# Patient Record
Sex: Female | Born: 1965 | Race: Black or African American | Hispanic: No | Marital: Married | Smoking: Current every day smoker
Health system: Southern US, Community
[De-identification: ages and names within clinical notes are randomized; demographics above are authoritative.]

## PROBLEM LIST (undated history)

## (undated) DIAGNOSIS — R519 Headache, unspecified: Secondary | ICD-10-CM

## (undated) DIAGNOSIS — R079 Chest pain, unspecified: Secondary | ICD-10-CM

## (undated) DIAGNOSIS — F32A Depression, unspecified: Secondary | ICD-10-CM

## (undated) DIAGNOSIS — R413 Other amnesia: Secondary | ICD-10-CM

## (undated) DIAGNOSIS — R002 Palpitations: Secondary | ICD-10-CM

## (undated) DIAGNOSIS — F419 Anxiety disorder, unspecified: Secondary | ICD-10-CM

## (undated) HISTORY — DX: Chest pain, unspecified: R07.9

## (undated) HISTORY — DX: Palpitations: R00.2

## (undated) HISTORY — DX: Anxiety disorder, unspecified: F41.9

## (undated) HISTORY — DX: Headache, unspecified: R51.9

## (undated) HISTORY — DX: Depression, unspecified: F32.A

## (undated) HISTORY — DX: Other amnesia: R41.3

## (undated) NOTE — Telephone Encounter (Signed)
 Formatting of this note is different from the original. Notes/instructions were copied from original source. Erminio Potters Rn  Patient Message 07/16/2020 [name]  MRN: [id number]  Encounter Messages Expand AllCollapse All Results  From  [facility] [place] [facility] [place] To  Buffie Herne and Delivered  07/16/2020 9:12 AM  Last Read in [email or web address]  07/16/2020 2:23 PM by Kristine Griffith  Hello Kristine Griffith,  My name is Laymon Isles and I am a Writer reviewing labs and messages for [employer], CNM.  Pam has retired and I will do my best to help with this transition.    Your pap smear results show normal cells on your cervix, but your HPV testing was positive.  Because of this, we would like to repeat your pap smear in one year.  Pap smears are typically performed every 3-5 years, but in the case of positive HPV testing, we will repeat this is one year.    Please feel free to contact me with any questions or concerns.    Laymon GORMAN Isles, CNM  Electronically signed by Potters Erminio RN at 01/27/2021  9:07 PM EDT

## (undated) NOTE — Telephone Encounter (Signed)
 Formatting of this note might be different from the original. Provider Laymon GORMAN Isles, CNM addressed with member.  Encounter date 07/16/2020, (secure patient msg. copied below).  Pt notified of abnormal pap smear results and plan of care for follow up pap in one year, (per provider's 07/16/2020 e-mail msg., copied below.    HMM added.  [facility] indicates provider's e-mail message sent on 07/16/2020 regarding pap results and one year follow up pap recommendation has been last read by member on 07/16/2020.  Electronically signed by Leigh America RN at 01/27/2021  9:07 PM EDT

---

## 1998-07-20 ENCOUNTER — Ambulatory Visit (HOSPITAL_COMMUNITY): Admission: AD | Admit: 1998-07-20 | Discharge: 1998-07-20 | Payer: Self-pay | Admitting: Obstetrics & Gynecology

## 1998-09-21 ENCOUNTER — Inpatient Hospital Stay (HOSPITAL_COMMUNITY): Admission: AD | Admit: 1998-09-21 | Discharge: 1998-09-21 | Payer: Self-pay | Admitting: Obstetrics & Gynecology

## 1998-09-24 ENCOUNTER — Inpatient Hospital Stay (HOSPITAL_COMMUNITY): Admission: AD | Admit: 1998-09-24 | Discharge: 1998-09-24 | Payer: Self-pay | Admitting: Obstetrics

## 1998-10-20 ENCOUNTER — Other Ambulatory Visit: Admission: RE | Admit: 1998-10-20 | Discharge: 1998-10-20 | Payer: Self-pay | Admitting: Obstetrics

## 1998-10-20 ENCOUNTER — Ambulatory Visit (HOSPITAL_COMMUNITY): Admission: RE | Admit: 1998-10-20 | Discharge: 1998-10-20 | Payer: Self-pay | Admitting: Obstetrics

## 1998-11-19 ENCOUNTER — Ambulatory Visit (HOSPITAL_COMMUNITY): Admission: RE | Admit: 1998-11-19 | Discharge: 1998-11-19 | Payer: Self-pay | Admitting: Obstetrics

## 1999-01-15 ENCOUNTER — Ambulatory Visit (HOSPITAL_COMMUNITY): Admission: RE | Admit: 1999-01-15 | Discharge: 1999-01-15 | Payer: Self-pay | Admitting: Obstetrics

## 1999-04-27 ENCOUNTER — Inpatient Hospital Stay (HOSPITAL_COMMUNITY): Admission: AD | Admit: 1999-04-27 | Discharge: 1999-04-29 | Payer: Self-pay | Admitting: Obstetrics

## 1999-05-04 ENCOUNTER — Inpatient Hospital Stay (HOSPITAL_COMMUNITY): Admission: AD | Admit: 1999-05-04 | Discharge: 1999-05-04 | Payer: Self-pay | Admitting: General Surgery

## 2000-07-20 ENCOUNTER — Emergency Department (HOSPITAL_COMMUNITY): Admission: EM | Admit: 2000-07-20 | Discharge: 2000-07-20 | Payer: Self-pay | Admitting: Emergency Medicine

## 2000-07-20 ENCOUNTER — Encounter: Payer: Self-pay | Admitting: *Deleted

## 2000-10-02 ENCOUNTER — Inpatient Hospital Stay (HOSPITAL_COMMUNITY): Admission: AD | Admit: 2000-10-02 | Discharge: 2000-10-02 | Payer: Self-pay | Admitting: Obstetrics

## 2001-03-17 ENCOUNTER — Inpatient Hospital Stay (HOSPITAL_COMMUNITY): Admission: AD | Admit: 2001-03-17 | Discharge: 2001-03-17 | Payer: Self-pay | Admitting: Obstetrics

## 2001-03-22 ENCOUNTER — Encounter: Payer: Self-pay | Admitting: Obstetrics & Gynecology

## 2001-03-22 ENCOUNTER — Inpatient Hospital Stay (HOSPITAL_COMMUNITY): Admission: AD | Admit: 2001-03-22 | Discharge: 2001-03-22 | Payer: Self-pay | Admitting: *Deleted

## 2001-03-26 ENCOUNTER — Inpatient Hospital Stay (HOSPITAL_COMMUNITY): Admission: AD | Admit: 2001-03-26 | Discharge: 2001-03-26 | Payer: Self-pay | Admitting: Obstetrics & Gynecology

## 2001-05-31 ENCOUNTER — Other Ambulatory Visit: Admission: RE | Admit: 2001-05-31 | Discharge: 2001-05-31 | Payer: Self-pay | Admitting: Obstetrics and Gynecology

## 2001-06-26 ENCOUNTER — Encounter: Payer: Self-pay | Admitting: Obstetrics and Gynecology

## 2001-06-26 ENCOUNTER — Inpatient Hospital Stay (HOSPITAL_COMMUNITY): Admission: AD | Admit: 2001-06-26 | Discharge: 2001-06-26 | Payer: Self-pay | Admitting: Obstetrics and Gynecology

## 2001-06-28 ENCOUNTER — Inpatient Hospital Stay (HOSPITAL_COMMUNITY): Admission: AD | Admit: 2001-06-28 | Discharge: 2001-06-30 | Payer: Self-pay | Admitting: Obstetrics and Gynecology

## 2001-12-11 ENCOUNTER — Inpatient Hospital Stay (HOSPITAL_COMMUNITY): Admission: AD | Admit: 2001-12-11 | Discharge: 2001-12-11 | Payer: Self-pay | Admitting: Obstetrics and Gynecology

## 2001-12-18 ENCOUNTER — Encounter (HOSPITAL_COMMUNITY): Admission: RE | Admit: 2001-12-18 | Discharge: 2002-01-08 | Payer: Self-pay | Admitting: Obstetrics and Gynecology

## 2002-01-11 ENCOUNTER — Inpatient Hospital Stay (HOSPITAL_COMMUNITY): Admission: AD | Admit: 2002-01-11 | Discharge: 2002-01-13 | Payer: Self-pay | Admitting: Obstetrics and Gynecology

## 2002-01-11 ENCOUNTER — Encounter (INDEPENDENT_AMBULATORY_CARE_PROVIDER_SITE_OTHER): Payer: Self-pay

## 2002-01-20 ENCOUNTER — Encounter: Payer: Self-pay | Admitting: Obstetrics and Gynecology

## 2002-01-20 ENCOUNTER — Observation Stay (HOSPITAL_COMMUNITY): Admission: AD | Admit: 2002-01-20 | Discharge: 2002-01-21 | Payer: Self-pay | Admitting: Obstetrics and Gynecology

## 2002-02-26 ENCOUNTER — Inpatient Hospital Stay (HOSPITAL_COMMUNITY): Admission: AD | Admit: 2002-02-26 | Discharge: 2002-02-26 | Payer: Self-pay | Admitting: Obstetrics and Gynecology

## 2002-07-18 ENCOUNTER — Inpatient Hospital Stay (HOSPITAL_COMMUNITY): Admission: AD | Admit: 2002-07-18 | Discharge: 2002-07-18 | Payer: Self-pay | Admitting: Obstetrics and Gynecology

## 2002-07-26 ENCOUNTER — Inpatient Hospital Stay (HOSPITAL_COMMUNITY): Admission: AD | Admit: 2002-07-26 | Discharge: 2002-07-26 | Payer: Self-pay | Admitting: Obstetrics and Gynecology

## 2002-09-28 ENCOUNTER — Emergency Department (HOSPITAL_COMMUNITY): Admission: EM | Admit: 2002-09-28 | Discharge: 2002-09-28 | Payer: Self-pay | Admitting: Emergency Medicine

## 2002-12-24 ENCOUNTER — Emergency Department (HOSPITAL_COMMUNITY): Admission: EM | Admit: 2002-12-24 | Discharge: 2002-12-24 | Payer: Self-pay | Admitting: *Deleted

## 2003-09-20 ENCOUNTER — Emergency Department (HOSPITAL_COMMUNITY): Admission: EM | Admit: 2003-09-20 | Discharge: 2003-09-20 | Payer: Self-pay | Admitting: Emergency Medicine

## 2004-06-28 ENCOUNTER — Emergency Department (HOSPITAL_COMMUNITY): Admission: EM | Admit: 2004-06-28 | Discharge: 2004-06-29 | Payer: Self-pay

## 2004-07-08 ENCOUNTER — Emergency Department (HOSPITAL_COMMUNITY): Admission: EM | Admit: 2004-07-08 | Discharge: 2004-07-08 | Payer: Self-pay | Admitting: Family Medicine

## 2005-04-05 ENCOUNTER — Emergency Department (HOSPITAL_COMMUNITY): Admission: EM | Admit: 2005-04-05 | Discharge: 2005-04-05 | Payer: Self-pay | Admitting: Emergency Medicine

## 2005-11-09 ENCOUNTER — Emergency Department (HOSPITAL_COMMUNITY): Admission: EM | Admit: 2005-11-09 | Discharge: 2005-11-09 | Payer: Self-pay | Admitting: Emergency Medicine

## 2005-12-12 ENCOUNTER — Emergency Department (HOSPITAL_COMMUNITY): Admission: EM | Admit: 2005-12-12 | Discharge: 2005-12-12 | Payer: Self-pay | Admitting: Emergency Medicine

## 2007-03-29 ENCOUNTER — Emergency Department (HOSPITAL_COMMUNITY): Admission: EM | Admit: 2007-03-29 | Discharge: 2007-03-29 | Payer: Self-pay | Admitting: Emergency Medicine

## 2007-03-29 ENCOUNTER — Inpatient Hospital Stay (HOSPITAL_COMMUNITY): Admission: AD | Admit: 2007-03-29 | Discharge: 2007-03-29 | Payer: Self-pay | Admitting: Obstetrics and Gynecology

## 2007-11-16 ENCOUNTER — Emergency Department (HOSPITAL_COMMUNITY): Admission: EM | Admit: 2007-11-16 | Discharge: 2007-11-16 | Payer: Self-pay | Admitting: Emergency Medicine

## 2008-08-15 ENCOUNTER — Emergency Department (HOSPITAL_COMMUNITY): Admission: EM | Admit: 2008-08-15 | Discharge: 2008-08-15 | Payer: Self-pay | Admitting: Family Medicine

## 2009-01-13 ENCOUNTER — Emergency Department (HOSPITAL_COMMUNITY): Admission: EM | Admit: 2009-01-13 | Discharge: 2009-01-13 | Payer: Self-pay | Admitting: Emergency Medicine

## 2009-07-29 ENCOUNTER — Emergency Department (HOSPITAL_COMMUNITY): Admission: EM | Admit: 2009-07-29 | Discharge: 2009-07-29 | Payer: Self-pay | Admitting: Family Medicine
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
## 2011-03-25 NOTE — Discharge Summary (Signed)
[facility]  Patient:    Kristine Griffith, Kristine Griffith Visit Number: [id number] MRN: [id number]          Service Type: OBV Location: 9300 [id number] Attending Physician:  Wandalee Ferdinand Dictated by:   Rudy Jew Ashley Royalty, M.D. Admit Date:  01/20/2002 Discharge Date: 01/21/2002                             Discharge Summary  DISCHARGE DIAGNOSES:          1. Intrauterine pregnancy at term,                                  delivered.                               2. Term birth living child, vertex.                               3. Desire for attempt at permanent                                  surgical sterilization.                               4. Status post right partial salpingectomy                                  including fimbriectomy.  OPERATIONS AND SPECIAL        1. OB delivery. PROCEDURES:                   2. Left tubal sterilization procedure.  CONSULTATIONS:                None.  HISTORY AND PHYSICAL:         This is a 45 year old, gravida 6, para 3-0-2-3, [redacted] weeks gestation, prenatal care uncomplicated except for smoking. The patient was admitted for induction secondary to smoking and advanced cervical changes. For the remainder of the past medical history, please see the chart.  HOSPITAL COURSE:              The patient was admitted to [facility] [place]. Admission laboratory studies were drawn. On January 11, 2002 she went on to deliver a 6-pound 3-ounce female, Apgars 9 at one minute, 9 at five minutes, sent to the newborn nursery. Delivery was accomplished per Dr. Lonell Face over at intact perineum. After delivery the patient stated a desire for attempt at permanent surgical sterilization. On January 11, 2002, she was taken to the operating room and underwent postpartum tubal sterilization. At the time of the procedure, no right fallopian tube could be identified. A left Pomeroy tubal sterilization procedure was performed. The  operation was uncomplicated. After the operation was completed, the operative note from the patients previous ectopic pregnancy was obtained and the operative report revealed a partial salpingectomy. The pathology report was also obtained which documented fimbriectomy. Hence, the patient was reassured that subsequent pregnancy chances are very remote. She was discharged home on January 13, 2002 afebrile  and in satisfactory condition.  ACCESSORY CLINICAL FINDINGS:      Hemoglobin and hematocrit on admission were 12.5 and 35.3, respectively. Repeat values obtained January 12, 2002 were 11.2 and 32.6, respectively.  DISPOSITION:                  The patient is to return to [facility] in four to six weeks for postpartum evaluation. Dictated by:   Rudy Jew Ashley Royalty, M.D. Attending Physician:  Wandalee Ferdinand DD:  02/02/02 TD:  02/02/02 Job: [id number]

## 2011-03-25 NOTE — Op Note (Signed)
[facility]  Patient:    Kristine Griffith, Kristine Griffith Visit Number: [id number] MRN: [id number]          Service Type: ANT Location: [facility] Attending Physician:  Wandalee Ferdinand Dictated by:   Rudy Jew Ashley Royalty, M.D. Admit Date:  12/18/2001 Discharge Date: 01/08/2002                             Operative Report  PREOPERATIVE DIAGNOSIS:       1. Desire for attempted permanent sterilization.                               2. Apparent right salpingectomy. POSTOPERATIVE DIAGNOSIS:      1. Desire for attempted permanent sterilization.                               2. Apparent right salpingectomy.                               3. Left tubal sterilization procedure                                  Barnett Abu).  OPERATION:                    Left tubal sterilization procedure Barnett Abu).  SURGEON:                      Rudy Jew. Ashley Royalty, M.D.  ANESTHESIA:                   Epidural.  ESTIMATED BLOOD LOSS:         Less than 10 cc.  COMPLICATIONS:                None.  PACKS AND DRAINS:             None.  DESCRIPTION OF PROCEDURE:     The patient was taken to the operating room, placed in the dorsal supine position.  She was prepped and draped in the usual manner for abdominal surgery.  Functioning epidural catheter was in place.  A 1.5 cm infraumbilical incision was made in the transverse plane.  Subcutaneous tissues were sharply dissected down to the fascia which was elevated with hemostats and entered atraumatically with Mayo scissors.  The underlying tissues were sharply and bluntly dissected down to the peritoneum which was elevated with hemostats and entered atraumatically with Metzenbaum scissors. Incision was extended slightly towards to accommodate [employer] retractors. The left fallopian tube was identified and traced to its fimbriated end with Babcock clamp.  An avascular area in the distal isthmic portion was chosen for ligation.  A #1 plain catgut  suture was placed on this segment of fallopian tube and tied.  A second suture was placed as well.  The intervening portion of fallopian tube was incised with Metzenbaum scissors and sent to pathology for histologic studies.  Hemostasis was noted.  Next, attention was turned to the right aspect of the uterus.  The round ligament was identified.  No fallopian tube could be identified.  We noted that there were a couple of references in the patients old chart alluding to a history  of a right salpingectomy even though the patients history was vague.  The intraoperative findings tended to corroborate the history of an ectopic pregnancy with right salpingectomy.  Hence, it was felt that the procedure was complete.  The medical records department had been asked to produce an operative note from the patients surgery for ectopic pregnancy prior to the patient having otherwise unprotected sex.  The fascia was closed with 0 Vicryl in a running fashion.  The skin was closed with 3-0 chromic in a subcuticular fashion.  The patient was taken to the recovery room in excellent condition. Dictated by:   Rudy Jew Ashley Royalty, M.D. Attending Physician:  Wandalee Ferdinand DD:  01/11/02 TD:  01/14/02 Job: [id number]

## 2011-03-25 NOTE — Op Note (Signed)
[facility]  Patient:    Kristine Griffith A Visit Number: [id number] MRN: [id number]          Service Type: GYN Location: [facility] Attending Physician:  Antionette Char Proc. Date: 07/20/98 Admit Date:  3//99/09/1   CC:         [facility]                           Operative Report  PREOPERATIVE DIAGNOSIS:       Cervical weakness.  POSTOPERATIVE DIAGNOSIS:      Cervical weakness.  PROCEDURE:                    McDonald cerclage.  SURGEON:                      Roseanna Rainbow, M.D.  ANESTHESIA:                   Spinal.  COMPLICATIONS:                None.  ESTIMATED BLOOD LOSS:         Less than 50 cc.  FINDINGS:                     Exam under anesthesia prior to placement of the suture revealed the cervix to have significant length to it with the external os being approximately 1-cm dilated, the internal os likely fingertip.  PROCEDURE:                    The patient was taken to the operating room where a spinal anesthetic was administered without difficulty.  She was then placed in the dorsal lithotomy position and prepped and draped in the usual sterile fashion.  A weighted speculum was then placed into the posterior fornix.  The cervix and vagina were irrigated with clindamycin douche.  Deavers were used to retract superiorly and laterally.  The cervix was grasped with sponge forceps and the level of the bladder reflection was noted anteriorly.  Just distal to the vesicocervical reflection, a suture comprised of a double suture of silk was used to encircle the cervix in four to five bites in a pursestring fashion.  The knot was then placed anteriorly with the cut free-ends left long. The vagina and cervix were then again irrigated with a clindamycin solution.  At the close of the procedure, the instrument and pack counts were said to be correct x 2.  The patient was taken to the PACU awake and in stable  condition. Attending Physician:  Antionette Char DD:  07/20/98 TD:  07/20/98 Job: [id number]

## 2011-03-25 NOTE — Discharge Summary (Signed)
[facility]  Patient:    Kristine Griffith, Kristine Griffith Visit Number: [id number] MRN: [id number]          Service Type: MED Location: 9300 [id number] Attending Physician:  Wandalee Ferdinand Dictated by:   Rudy Jew Ashley Royalty, M.D. Admit Date:  06/28/2001 Discharge Date: 06/30/2001                             Discharge Summary  DISCHARGE DIAGNOSES:          1. Intrauterine pregnancy at [redacted] weeks                                  gestation.                               2. Pyelonephritis.  OPERATIONS AND SPECIAL PROCEDURES:           None.  CONSULTATIONS:                None.  DISCHARGE MEDICATIONS:        1. Macrobid one b.i.d. x 10 days.                               2. Percocet one p.o. q.3-4h. p.r.n. pain.  HISTORY AND PHYSICAL:         This is a 45 year old, gravida 6, para 3, Ab 3, who presented August 20 complaining of UTI symptoms. She was given a diagnosis of urinary tract infection and given a prescription for Macrobid but never had it filled. She returned on August 22 complaining of chills, headache, left lower quadrant discomfort, etc. She was found to have CVA tenderness on the right side as well as a microscopic urine suggestive of urinary tract infection. She was hence admitted for diagnosis of intrauterine pregnancy with pyelonephritis.  HOSPITAL COURSE:              The patient was admitted to [facility]. Initial laboratory studies were normal. She was given IV antibiotics consisting of Unasyn 3 g IV q.6h. She responded nicely to this regimen, and on June 30, 2001 she was felt stable for discharge, and was discharged home in satisfactory condition.  ACCESSORY CLINICAL FINDINGS:  Hemoglobin and hematocrit on admission were 12.5 and 36.4, respectively. Repeat values obtained on August 23: 10.6 and 30.7, respectively. White blood count on these _______ was 14.7 and 11.3, respectively. Urinalysis revealed small  hemoglobin, greater than 80 ketones, negative leukocyte esterase, few epithelials, rare bacteria.  DISPOSITION:                  The patient is to return to [facility] in several days for followup. Dictated by:   Rudy Jew Ashley Royalty, M.D. Attending Physician:  Wandalee Ferdinand DD:  07/24/01 TD:  07/24/01 Job: [id number]

## 2011-03-25 NOTE — Discharge Summary (Signed)
[facility]  Patient:    LOYOLA, SANTINO Visit Number: [id number] MRN: [id number]          Service Type: Attending:  Rudy Jew. Ashley Royalty, M.D. Dictated by:   Rudy Jew Ashley Royalty, M.D. Adm. Date:  01/20/02 Disc. Date: 01/21/02                             Discharge Summary  DISCHARGE DIAGNOSES:          1. Probable postoperative discomfort from left                                  tubal sterilization procedure.                               2. Vaginal bleeding approximately 10 days                                  postpartum, appropriate.  PROCEDURES:                   None.  DISCHARGE MEDICATIONS:        Lorcet Plus one q.3-4h. p.r.n. pain.  HISTORY OF PRESENT ILLNESS:   This is a 45 year old patient 10 days postpartum.  She had a left tubal sterilization procedure performed on the day of delivery.  She is status post right salpingectomy and has had no procedure done on that side.  The patient presented yesterday complaining of bleeding and cramping postpartum.  She was seen in the emergency room at [facility] and evaluated by Ronda Fairly. Galen Daft, M.D.  An ultrasound was obtained and reportedly the ultrasound revealed findings compatible with retained products of conception.  The placenta was removed spontaneously at the time of delivery.  No manual removal was required.  The patient was admitted for observation and for trial of antibiotics.  She was placed on 23-hour observation only.  HOSPITAL COURSE:              CBC was obtained initially, which revealed white blood count to be perfectly normal at 7600.  Hemoglobin was 12.1, hematocrit 35.5.  Repeat values were obtained the next day, which revealed a white blood count of 6900, hemoglobin 11.4, and hematocrit 32.8, respectively.  She appeared to have pain associated with the left tubal sterilization procedure only.  The bleeding was modest, and a decision was made to allow her to return home  with careful follow-up.  ACCESSORY CLINICAL FINDINGS:  As above.  DISPOSITION:                  The patient is discharged home afebrile and in satisfactory condition.  She was given a prescription for Lorcet Plus #20, one p.o. q.3-4h. p.r.n. pain.  She is asked specifically to remain NPO after midnight tonight and to call the office in the morning for an update.  She was also asked to keep her standard postpartum appointment as well. Dictated by:   Rudy Jew Ashley Royalty, M.D. Attending:  Rudy Jew Ashley Royalty, M.D. DD:  01/21/02 TD:  01/22/02 Job: [id number]

## 2011-07-27 LAB — INFLUENZA A+B VIRUS AG-DIRECT(RAPID): Inflenza A Ag: NEGATIVE

## 2011-07-27 LAB — RAPID STREP SCREEN (MED CTR MEBANE ONLY): Streptococcus, Group A Screen (Direct): NEGATIVE

## 2017-06-01 ENCOUNTER — Ambulatory Visit (INDEPENDENT_AMBULATORY_CARE_PROVIDER_SITE_OTHER): Payer: Self-pay | Admitting: Nurse Practitioner

## 2017-06-01 ENCOUNTER — Encounter: Payer: Self-pay | Admitting: Nurse Practitioner

## 2017-06-01 VITALS — BP 100/64 | HR 90 | Temp 98.8°F | Ht 67.0 in | Wt 146.2 lb

## 2017-06-01 DIAGNOSIS — Z0000 Encounter for general adult medical examination without abnormal findings: Secondary | ICD-10-CM

## 2017-06-01 DIAGNOSIS — F319 Bipolar disorder, unspecified: Secondary | ICD-10-CM

## 2017-06-01 NOTE — Patient Instructions (Addendum)
Health Maintenance, Female Adopting a healthy lifestyle and getting preventive care can go a long way to promote health and wellness. Talk with your health care provider about what schedule of regular examinations is right for you. This is a good chance for you to check in with your provider about disease prevention and staying healthy. In between checkups, there are plenty of things you can do on your own. Experts have done a lot of research about which lifestyle changes and preventive measures are most likely to keep you healthy. Ask your health care provider for more information. Weight and diet Eat a healthy diet  Be sure to include plenty of vegetables, fruits, low-fat dairy products, and lean protein.  Do not eat a lot of foods high in solid fats, added sugars, or salt.  Get regular exercise. This is one of the most important things you can do for your health. ? Most adults should exercise for at least 150 minutes each week. The exercise should increase your heart rate and make you sweat (moderate-intensity exercise). ? Most adults should also do strengthening exercises at least twice a week. This is in addition to the moderate-intensity exercise.  Maintain a healthy weight  Body mass index (BMI) is a measurement that can be used to identify possible weight problems. It estimates body fat based on height and weight. Your health care provider can help determine your BMI and help you achieve or maintain a healthy weight.  For females 20 years of age and older: ? A BMI below 18.5 is considered underweight. ? A BMI of 18.5 to 24.9 is normal. ? A BMI of 25 to 29.9 is considered overweight. ? A BMI of 30 and above is considered obese.  Watch levels of cholesterol and blood lipids  You should start having your blood tested for lipids and cholesterol at 51 years of age, then have this test every 5 years.  You may need to have your cholesterol levels checked more often if: ? Your lipid or  cholesterol levels are high. ? You are older than 50 years of age. ? You are at high risk for heart disease.  Cancer screening Lung Cancer  Lung cancer screening is recommended for adults 55-80 years old who are at high risk for lung cancer because of a history of smoking.  A yearly low-dose CT scan of the lungs is recommended for people who: ? Currently smoke. ? Have quit within the past 15 years. ? Have at least a 30-pack-year history of smoking. A pack year is smoking an average of one pack of cigarettes a day for 1 year.  Yearly screening should continue until it has been 15 years since you quit.  Yearly screening should stop if you develop a health problem that would prevent you from having lung cancer treatment.  Breast Cancer  Practice breast self-awareness. This means understanding how your breasts normally appear and feel.  It also means doing regular breast self-exams. Let your health care provider know about any changes, no matter how small.  If you are in your 20s or 30s, you should have a clinical breast exam (CBE) by a health care provider every 1-3 years as part of a regular health exam.  If you are 40 or older, have a CBE every year. Also consider having a breast X-ray (mammogram) every year.  If you have a family history of breast cancer, talk to your health care provider about genetic screening.  If you are at high risk   for breast cancer, talk to your health care provider about having an MRI and a mammogram every year.  Breast cancer gene (BRCA) assessment is recommended for women who have family members with BRCA-related cancers. BRCA-related cancers include: ? Breast. ? Ovarian. ? Tubal. ? Peritoneal cancers.  Results of the assessment will determine the need for genetic counseling and BRCA1 and BRCA2 testing.  Cervical Cancer Your health care provider may recommend that you be screened regularly for cancer of the pelvic organs (ovaries, uterus, and  vagina). This screening involves a pelvic examination, including checking for microscopic changes to the surface of your cervix (Pap test). You may be encouraged to have this screening done every 3 years, beginning at age 22.  For women ages 56-65, health care providers may recommend pelvic exams and Pap testing every 3 years, or they may recommend the Pap and pelvic exam, combined with testing for human papilloma virus (HPV), every 5 years. Some types of HPV increase your risk of cervical cancer. Testing for HPV may also be done on women of any age with unclear Pap test results.  Other health care providers may not recommend any screening for nonpregnant women who are considered low risk for pelvic cancer and who do not have symptoms. Ask your health care provider if a screening pelvic exam is right for you.  If you have had past treatment for cervical cancer or a condition that could lead to cancer, you need Pap tests and screening for cancer for at least 20 years after your treatment. If Pap tests have been discontinued, your risk factors (such as having a new sexual partner) need to be reassessed to determine if screening should resume. Some women have medical problems that increase the chance of getting cervical cancer. In these cases, your health care provider may recommend more frequent screening and Pap tests.  Colorectal Cancer  This type of cancer can be detected and often prevented.  Routine colorectal cancer screening usually begins at 51 years of age and continues through 51 years of age.  Your health care provider may recommend screening at an earlier age if you have risk factors for colon cancer.  Your health care provider may also recommend using home test kits to check for hidden blood in the stool.  A small camera at the end of a tube can be used to examine your colon directly (sigmoidoscopy or colonoscopy). This is done to check for the earliest forms of colorectal  cancer.  Routine screening usually begins at age 33.  Direct examination of the colon should be repeated every 5-10 years through 51 years of age. However, you may need to be screened more often if early forms of precancerous polyps or small growths are found.  Skin Cancer  Check your skin from head to toe regularly.  Tell your health care provider about any new moles or changes in moles, especially if there is a change in a mole's shape or color.  Also tell your health care provider if you have a mole that is larger than the size of a pencil eraser.  Always use sunscreen. Apply sunscreen liberally and repeatedly throughout the day.  Protect yourself by wearing long sleeves, pants, a wide-brimmed hat, and sunglasses whenever you are outside.  Heart disease, diabetes, and high blood pressure  High blood pressure causes heart disease and increases the risk of stroke. High blood pressure is more likely to develop in: ? People who have blood pressure in the high end of  the normal range (130-139/85-89 mm Hg). ? People who are overweight or obese. ? People who are African American.  If you are 21-29 years of age, have your blood pressure checked every 3-5 years. If you are 3 years of age or older, have your blood pressure checked every year. You should have your blood pressure measured twice-once when you are at a hospital or clinic, and once when you are not at a hospital or clinic. Record the average of the two measurements. To check your blood pressure when you are not at a hospital or clinic, you can use: ? An automated blood pressure machine at a pharmacy. ? A home blood pressure monitor.  If you are between 17 years and 37 years old, ask your health care provider if you should take aspirin to prevent strokes.  Have regular diabetes screenings. This involves taking a blood sample to check your fasting blood sugar level. ? If you are at a normal weight and have a low risk for diabetes,  have this test once every three years after 51 years of age. ? If you are overweight and have a high risk for diabetes, consider being tested at a younger age or more often. Preventing infection Hepatitis B  If you have a higher risk for hepatitis B, you should be screened for this virus. You are considered at high risk for hepatitis B if: ? You were born in a country where hepatitis B is common. Ask your health care provider which countries are considered high risk. ? Your parents were born in a high-risk country, and you have not been immunized against hepatitis B (hepatitis B vaccine). ? You have HIV or AIDS. ? You use needles to inject street drugs. ? You live with someone who has hepatitis B. ? You have had sex with someone who has hepatitis B. ? You get hemodialysis treatment. ? You take certain medicines for conditions, including cancer, organ transplantation, and autoimmune conditions.  Hepatitis C  Blood testing is recommended for: ? Everyone born from [birth year] through 1965. ? Anyone with known risk factors for hepatitis C.  Sexually transmitted infections (STIs)  You should be screened for sexually transmitted infections (STIs) including gonorrhea and chlamydia if: ? You are sexually active and are younger than 51 years of age. ? You are older than 51 years of age and your health care provider tells you that you are at risk for this type of infection. ? Your sexual activity has changed since you were last screened and you are at an increased risk for chlamydia or gonorrhea. Ask your health care provider if you are at risk.  If you do not have HIV, but are at risk, it may be recommended that you take a prescription medicine daily to prevent HIV infection. This is called pre-exposure prophylaxis (PrEP). You are considered at risk if: ? You are sexually active and do not regularly use condoms or know the HIV status of your partner(s). ? You take drugs by injection. ? You are  sexually active with a partner who has HIV.  Talk with your health care provider about whether you are at high risk of being infected with HIV. If you choose to begin PrEP, you should first be tested for HIV. You should then be tested every 3 months for as long as you are taking PrEP. Pregnancy  If you are premenopausal and you may become pregnant, ask your health care provider about preconception counseling.  If you may become  pregnant, take 400 to 800 micrograms (mcg) of folic acid every day.  If you want to prevent pregnancy, talk to your health care provider about birth control (contraception). Osteoporosis and menopause  Osteoporosis is a disease in which the bones lose minerals and strength with aging. This can result in serious bone fractures. Your risk for osteoporosis can be identified using a bone density scan.  If you are 75 years of age or older, or if you are at risk for osteoporosis and fractures, ask your health care provider if you should be screened.  Ask your health care provider whether you should take a calcium or vitamin D supplement to lower your risk for osteoporosis.  Menopause may have certain physical symptoms and risks.  Hormone replacement therapy may reduce some of these symptoms and risks. Talk to your health care provider about whether hormone replacement therapy is right for you. Follow these instructions at home:  Schedule regular health, dental, and eye exams.  Stay current with your immunizations.  Do not use any tobacco products including cigarettes, chewing tobacco, or electronic cigarettes.  If you are pregnant, do not drink alcohol.  If you are breastfeeding, limit how much and how often you drink alcohol.  Limit alcohol intake to no more than 1 drink per day for nonpregnant women. One drink equals 12 ounces of beer, 5 ounces of wine, or 1 ounces of hard liquor.  Do not use street drugs.  Do not share needles.  Ask your health care  provider for help if you need support or information about quitting drugs.  Tell your health care provider if you often feel depressed.  Tell your health care provider if you have ever been abused or do not feel safe at home. This information is not intended to replace advice given to you by your health care provider. Make sure you discuss any questions you have with your health care provider. Document Released: 05/09/2011 Document Revised: 03/31/2016 Document Reviewed: 07/28/2015 Elsevier Interactive Patient Education  2018 [employer].  Steps to Quit Smoking Smoking tobacco can be harmful to your health and can affect almost every organ in your body. Smoking puts you, and those around you, at risk for developing many serious chronic diseases. Quitting smoking is difficult, but it is one of the best things that you can do for your health. It is never too late to quit. What are the benefits of quitting smoking? When you quit smoking, you lower your risk of developing serious diseases and conditions, such as:  Lung cancer or lung disease, such as COPD.  Heart disease.  Stroke.  Heart attack.  Infertility.  Osteoporosis and bone fractures.  Additionally, symptoms such as coughing, wheezing, and shortness of breath may get better when you quit. You may also find that you get sick less often because your body is stronger at fighting off colds and infections. If you are pregnant, quitting smoking can help to reduce your chances of having a baby of low birth weight. How do I get ready to quit? When you decide to quit smoking, create a plan to make sure that you are successful. Before you quit:  Pick a date to quit. Set a date within the next two weeks to give you time to prepare.  Write down the reasons why you are quitting. Keep this list in places where you will see it often, such as on your bathroom mirror or in your car or wallet.  Identify the people, places, things, and  activities  that make you want to smoke (triggers) and avoid them. Make sure to take these actions: ? Throw away all cigarettes at home, at work, and in your car. ? Throw away smoking accessories, such as Scientist, research (medical). ? Clean your car and make sure to empty the ashtray. ? Clean your home, including curtains and carpets.  Tell your family, friends, and coworkers that you are quitting. Support from your loved ones can make quitting easier.  Talk with your health care provider about your options for quitting smoking.  Find out what treatment options are covered by your health insurance.  What strategies can I use to quit smoking? Talk with your healthcare provider about different strategies to quit smoking. Some strategies include:  Quitting smoking altogether instead of gradually lessening how much you smoke over a period of time. Research shows that quitting "cold [place]" is more successful than gradually quitting.  Attending in-person counseling to help you build problem-solving skills. You are more likely to have success in quitting if you attend several counseling sessions. Even short sessions of 10 minutes can be effective.  Finding resources and support systems that can help you to quit smoking and remain smoke-free after you quit. These resources are most helpful when you use them often. They can include: ? Online chats with a Social worker. ? Telephone quitlines. ? Careers information officer. ? Support groups or group counseling. ? Text messaging programs. ? Mobile phone applications.  Taking medicines to help you quit smoking. (If you are pregnant or breastfeeding, talk with your health care provider first.) Some medicines contain nicotine and some do not. Both types of medicines help with cravings, but the medicines that include nicotine help to relieve withdrawal symptoms. Your health care provider may recommend: ? Nicotine patches, gum, or lozenges. ? Nicotine inhalers or  sprays. ? Non-nicotine medicine that is taken by mouth.  Talk with your health care provider about combining strategies, such as taking medicines while you are also receiving in-person counseling. Using these two strategies together makes you more likely to succeed in quitting than if you used either strategy on its own. If you are pregnant or breastfeeding, talk with your health care provider about finding counseling or other support strategies to quit smoking. Do not take medicine to help you quit smoking unless told to do so by your health care provider. What things can I do to make it easier to quit? Quitting smoking might feel overwhelming at first, but there is a lot that you can do to make it easier. Take these important actions:  Reach out to your family and friends and ask that they support and encourage you during this time. Call telephone quitlines, reach out to support groups, or work with a counselor for support.  Ask people who smoke to avoid smoking around you.  Avoid places that trigger you to smoke, such as bars, parties, or smoke-break areas at work.  Spend time around people who do not smoke.  Lessen stress in your life, because stress can be a smoking trigger for some people. To lessen stress, try: ? Exercising regularly. ? Deep-breathing exercises. ? Yoga. ? Meditating. ? Performing a body scan. This involves closing your eyes, scanning your body from head to toe, and noticing which parts of your body are particularly tense. Purposefully relax the muscles in those areas.  Download or purchase mobile phone or tablet apps (applications) that can help you stick to your quit plan by providing reminders, tips, and  encouragement. There are many free apps, such as QuitGuide from the [employer] developer for Disease Control and Prevention). You can find other support for quitting smoking (smoking cessation) through smokefree.gov and other websites.  How will I feel when I quit  smoking? Within the first 24 hours of quitting smoking, you may start to feel some withdrawal symptoms. These symptoms are usually most noticeable 2-3 days after quitting, but they usually do not last beyond 2-3 weeks. Changes or symptoms that you might experience include:  Mood swings.  Restlessness, anxiety, or irritation.  Difficulty concentrating.  Dizziness.  Strong cravings for sugary foods in addition to nicotine.  Mild weight gain.  Constipation.  Nausea.  Coughing or a sore throat.  Changes in how your medicines work in your body.  A depressed mood.  Difficulty sleeping (insomnia).  After the first 2-3 weeks of quitting, you may start to notice more positive results, such as:  Improved sense of smell and taste.  Decreased coughing and sore throat.  Slower heart rate.  Lower blood pressure.  Clearer skin.  The ability to breathe more easily.  Fewer sick days.  Quitting smoking is very challenging for most people. Do not get discouraged if you are not successful the first time. Some people need to make many attempts to quit before they achieve long-term success. Do your best to stick to your quit plan, and talk with your health care provider if you have any questions or concerns. This information is not intended to replace advice given to you by your health care provider. Make sure you discuss any questions you have with your health care provider. Document Released: 10/18/2001 Document Revised: 06/21/2016 Document Reviewed: 03/10/2015 Elsevier Interactive Patient Education  2017 Else[employer] Inc[employer]  Bipolar 1 Disorder Bipolar 1 disorder is a mental health disorder in which a person has episodes of emotional highs (mania), and may also have episodes of emotional lows (depression) in addition to highs. Bipolar 1 disorder is different from other bipolar disorders because it involves extreme manic episodes. These episodes last at least one week or involve symptoms that  are so severe that hospitalization is needed to keep the person safe. What increases the risk? The cause of this condition is not known. However, certain factors make you more likely to have bipolar disorder, such as:  Having a family member with the disorder.  An imbalance of certain chemicals in the brain (neurotransmitters).  Stress, such as illness, financial problems, or a death.  Certain conditions that affect the brain or spinal cord (neurologic conditions).  Brain injury (trauma).  Having another mental health disorder, such as: ? Obsessive compulsive disorder. ? Schizophrenia.  What are the signs or symptoms? Symptoms of mania include:  Very high self-esteem or self-confidence.  Decreased need for sleep.  Unusual talkativeness or feeling a need to keep talking. Speech may be very fast. It may seem like you cannot stop talking.  Racing thoughts or constant talking, with quick shifts between topics that may or may not be related (flight of ideas).  Decreased ability to focus or concentrate.  Increased purposeful activity, such as work, studies, or social activity.  Increased nonproductive activity. This could be pacing, squirming and fidgeting, or finger and toe tapping.  Impulsive behavior and poor judgment. This may result in high-risk activities, such as having unprotected sex or spending a lot of money.  Symptoms of depression include:  Feeling sad, hopeless, or helpless.  Frequent or uncontrollable crying.  Lack of feeling or caring  about anything.  Sleeping too much.  Moving more slowly than usual.  Not being able to enjoy things you used to enjoy.  Wanting to be alone all the time.  Feeling guilty or worthless.  Lack of energy or motivation.  Trouble concentrating or remembering.  Trouble making decisions.  Increased appetite.  Thoughts of death, or the desire to harm yourself.  Sometimes, you may have a mixed mood. This means having  symptoms of depression and mania. Stress can make symptoms worse. How is this diagnosed? To diagnose bipolar disorder, your health care provider may ask about your:  Emotional episodes.  Medical history.  Alcohol and drug use. This includes prescription medicines. Certain medical conditions and substances can cause symptoms that seem like bipolar disorder (secondary bipolar disorder).  How is this treated? Bipolar disorder is a long-term (chronic) illness. It is best controlled with ongoing (continuous) treatment rather than treatment only when symptoms occur. Treatment may include:  Medicine. Medicine can be prescribed by a provider who specializes in treating mental disorders (psychiatrist). ? Medicines called mood stabilizers are usually prescribed. ? If symptoms occur even while taking a mood stabilizer, other medicines may be added.  Psychotherapy. Some forms of talk therapy, such as cognitive-behavioral therapy (CBT), can provide support, education, and guidance.  Coping methods, such as journaling or relaxation exercises. These may include: ? Yoga. ? Meditation. ? Deep breathing.  Lifestyle changes, such as: ? Limiting alcohol and drug use. ? Exercising regularly. ? Getting plenty of sleep. ? Making healthy eating choices.  A combination of medicine, talk therapy, and coping methods is best. A procedure in which electricity is applied to the brain through the scalp (electroconvulsive therapy) may be used in cases of severe mania when medicine and psychotherapy work too slowly or do not work. Follow these instructions at home: Activity   Return to your normal activities as told by your health care provider.  Find activities that you enjoy, and make time to do them.  Exercise regularly as told by your health care provider. Lifestyle  Limit alcohol intake to no more than 1 drink a day for nonpregnant women and 2 drinks a day for men. One drink equals 12 oz of beer, 5 oz  of wine, or 1 oz of hard liquor.  Follow a set schedule for eating and sleeping.  Eat a balanced diet that includes fresh fruits and vegetables, whole grains, low-fat dairy, and lean meat.  Get 7-8 hours of sleep each night. General instructions  Take over-the-counter and prescription medicines only as told by your health care provider.  Think about joining a support group. Your health care provider may be able to recommend a support group.  Talk with your family and loved ones about your treatment goals and how they can help.  Keep all follow-up visits as told by your health care provider. This is important. Where to find more information: For more information about bipolar disorder, visit the following websites:  [employer] on Mental Illness: www.nami.[place]: https://carter.com/  Contact a health care provider if:  Your symptoms get worse.  You have side effects from your medicine, and they get worse.  You have trouble sleeping.  You have trouble doing daily activities.  You feel unsafe in your surroundings.  You are dealing with substance abuse. Get help right away if:  You have new symptoms.  You have thoughts about harming yourself.  You self-harm. This information is not intended to  replace advice given to you by your health care provider. Make sure you discuss any questions you have with your health care provider. Document Released: 01/30/2001 Document Revised: 06/19/2016 Document Reviewed: 06/23/2016 Elsevier Interactive Patient Education  [employer].
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
## 2023-10-08 DIAGNOSIS — Z419 Encounter for procedure for purposes other than remedying health state, unspecified: Secondary | ICD-10-CM | POA: Diagnosis not present

## 2023-11-08 DIAGNOSIS — Z419 Encounter for procedure for purposes other than remedying health state, unspecified: Secondary | ICD-10-CM | POA: Diagnosis not present

## 2023-12-09 DIAGNOSIS — Z419 Encounter for procedure for purposes other than remedying health state, unspecified: Secondary | ICD-10-CM | POA: Diagnosis not present

## 2024-01-03 ENCOUNTER — Emergency Department (HOSPITAL_COMMUNITY)
Admission: EM | Admit: 2024-01-03 | Discharge: 2024-01-03 | Disposition: A | Discharge status: Home / Self Care | Payer: Medicaid Other

## 2024-01-03 ENCOUNTER — Other Ambulatory Visit: Payer: Self-pay

## 2024-01-03 DIAGNOSIS — X118XXA Contact with other hot tap-water, initial encounter: Secondary | ICD-10-CM | POA: Insufficient documentation

## 2024-01-03 DIAGNOSIS — T24112A Burn of first degree of left thigh, initial encounter: Secondary | ICD-10-CM | POA: Insufficient documentation

## 2024-01-03 DIAGNOSIS — Z789 Other specified health status: Secondary | ICD-10-CM | POA: Diagnosis not present

## 2024-01-03 DIAGNOSIS — T310 Burns involving less than 10% of body surface: Secondary | ICD-10-CM | POA: Diagnosis not present

## 2024-01-03 DIAGNOSIS — T24211A Burn of second degree of right thigh, initial encounter: Secondary | ICD-10-CM | POA: Diagnosis not present

## 2024-01-03 DIAGNOSIS — R9431 Abnormal electrocardiogram [ECG] [EKG]: Secondary | ICD-10-CM | POA: Diagnosis not present

## 2024-01-03 DIAGNOSIS — T300 Burn of unspecified body region, unspecified degree: Secondary | ICD-10-CM

## 2024-01-03 DIAGNOSIS — T24212A Burn of second degree of left thigh, initial encounter: Secondary | ICD-10-CM | POA: Diagnosis not present

## 2024-01-03 DIAGNOSIS — I959 Hypotension, unspecified: Secondary | ICD-10-CM | POA: Diagnosis not present

## 2024-01-03 LAB — BASIC METABOLIC PANEL
Anion gap: 14 (ref 5–15)
BUN: 18 mg/dL (ref 6–20)
CO2: 23 mmol/L (ref 22–32)
Calcium: 9.6 mg/dL (ref 8.9–10.3)
Chloride: 106 mmol/L (ref 98–111)
Creatinine, Ser: 0.83 mg/dL (ref 0.44–1.00)
GFR, Estimated: >60 mL/min (ref >60)
Glucose, Bld: 94 mg/dL (ref 70–99)
Potassium: 4.1 mmol/L (ref 3.5–5.1)
Sodium: 143 mmol/L (ref 135–145)

## 2024-01-03 LAB — CBC
HCT: 37.4 % (ref 36.0–46.0)
Hemoglobin: 12.6 g/dL (ref 12.0–15.0)
MCH: 31.4 pg (ref 26.0–34.0)
MCHC: 33.7 g/dL (ref 30.0–36.0)
MCV: 93.3 fL (ref 80.0–100.0)
Platelets: 218 10*3/uL (ref 150–400)
RBC: 4.01 MIL/uL (ref 3.87–5.11)
RDW: 13.9 % (ref 11.5–15.5)
WBC: 9.7 10*3/uL (ref 4.0–10.5)
nRBC: 0 % (ref 0.0–0.2)

## 2024-01-03 MED ORDER — BACITRACIN ZINC 500 UNIT/GM EX OINT: TOPICAL_OINTMENT | Freq: Once | CUTANEOUS | Status: DC

## 2024-01-03 MED ORDER — HYDROMORPHONE HCL 1 MG/ML IJ SOLN
0.5000 mg | Freq: Once | INTRAMUSCULAR | Status: AC
  Administered 2024-01-03: 0.5 mg via INTRAVENOUS
  Filled 2024-01-03: qty 1

## 2024-01-03 MED ORDER — OXYCODONE-ACETAMINOPHEN 5-325 MG PO TABS: 1.0000 | ORAL_TABLET | Freq: Four times a day (QID) | ORAL | 0 refills | Status: DC | PRN

## 2024-01-03 MED ORDER — OXYCODONE-ACETAMINOPHEN 5-325 MG PO TABS
1.0000 | ORAL_TABLET | Freq: Once | ORAL | Status: AC
  Administered 2024-01-03: 1 via ORAL
  Filled 2024-01-03: qty 1
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
## 2024-01-06 DIAGNOSIS — Z419 Encounter for procedure for purposes other than remedying health state, unspecified: Secondary | ICD-10-CM | POA: Diagnosis not present

## 2024-01-18 ENCOUNTER — Encounter: Payer: Self-pay | Admitting: Obstetrics and Gynecology

## 2024-02-17 DIAGNOSIS — Z419 Encounter for procedure for purposes other than remedying health state, unspecified: Secondary | ICD-10-CM | POA: Diagnosis not present

## 2024-03-18 DIAGNOSIS — Z419 Encounter for procedure for purposes other than remedying health state, unspecified: Secondary | ICD-10-CM | POA: Diagnosis not present

## 2024-04-04 ENCOUNTER — Encounter (HOSPITAL_COMMUNITY): Payer: Self-pay

## 2024-04-04 ENCOUNTER — Emergency Department (HOSPITAL_COMMUNITY)
Admission: EM | Admit: 2024-04-04 | Discharge: 2024-04-04 | Disposition: A | Discharge status: Home / Self Care | Attending: Emergency Medicine | Admitting: Emergency Medicine

## 2024-04-04 ENCOUNTER — Other Ambulatory Visit: Payer: Self-pay

## 2024-04-04 DIAGNOSIS — G8929 Other chronic pain: Secondary | ICD-10-CM | POA: Diagnosis not present

## 2024-04-04 DIAGNOSIS — M545 Low back pain, unspecified: Secondary | ICD-10-CM | POA: Diagnosis not present

## 2024-04-04 DIAGNOSIS — M5459 Other low back pain: Secondary | ICD-10-CM | POA: Diagnosis not present

## 2024-04-04 MED ORDER — OXYCODONE-ACETAMINOPHEN 5-325 MG PO TABS
1.0000 | ORAL_TABLET | ORAL | Status: DC | PRN
  Administered 2024-04-04: 1 via ORAL
  Filled 2024-04-04: qty 1

## 2024-04-04 MED ORDER — GABAPENTIN 300 MG PO CAPS: 300.0000 mg | ORAL_CAPSULE | Freq: Three times a day (TID) | ORAL | 0 refills | Status: DC

## 2024-04-04 MED ORDER — CYCLOBENZAPRINE HCL 10 MG PO TABS: 10.0000 mg | ORAL_TABLET | Freq: Two times a day (BID) | ORAL | 0 refills | Status: DC | PRN

## 2024-04-04 MED ORDER — IBUPROFEN 400 MG PO TABS
400.0000 mg | ORAL_TABLET | Freq: Once | ORAL | Status: AC | PRN
  Administered 2024-04-04: 400 mg via ORAL
  Filled 2024-04-04: qty 1

## 2024-04-04 NOTE — ED Provider Notes (Signed)
 [place] EMERGENCY DEPARTMENT AT [facility] Provider Note   CSN: [id number] Arrival date & time: 04/04/24  1035     History  Chief Complaint  Patient presents with   Back Pain    Kristine Griffith is a 58 y.o. female.  HPI 58 year old female history of low back pain presents today with worsened back pain over the past 3 days.  She states that she has been on Neurontin in the past.  She took one of her dad's Neurontin and feels that this helps.  She is currently new to the area and does not have a current provider.  She has been taking over-the-counter ibuprofen without relief.  She received Percocet and ibuprofen here prior to my evaluation and feels some relief with this.  She denies loss of bowel or bladder control, numbness, tingling, or weakness.  She has had no falls or direct trauma to the area, fever, or history of IV drug use    Home Medications Prior to Admission medications   Medication Sig Start Date End Date Taking? Authorizing Provider  cyclobenzaprine (FLEXERIL) 10 MG tablet Take 1 tablet (10 mg total) by mouth 2 (two) times daily as needed for muscle spasms. 04/04/24  Yes Auston Blush, MD  gabapentin (NEURONTIN) 300 MG capsule Take 1 capsule (300 mg total) by mouth 3 (three) times daily. 04/04/24  Yes Auston Blush, MD  oxyCODONE -acetaminophen  (PERCOCET/ROXICET) 5-325 MG tablet Take 1 tablet by mouth every 6 (six) hours as needed for severe pain (pain score 7-10). 01/03/24   Rolinda Climes, DO      Allergies    Penicillins    Review of Systems   Review of Systems  Physical Exam Updated Vital Signs BP 126/84 (BP Location: Right Arm)   Pulse 88   Temp 98.5 F (36.9 C)   Resp 20   Ht 1.676 m (5\' 6" )   Wt 64.9 kg   SpO2 100%   BMI 23.08 kg/m  Physical Exam Vitals and nursing note reviewed.  Constitutional:      General: She is not in acute distress.    Appearance: She is well-developed.  HENT:     Head: Normocephalic and atraumatic.      Right Ear: External ear normal.     Left Ear: External ear normal.     Nose: Nose normal.  Eyes:     Conjunctiva/sclera: Conjunctivae normal.     Pupils: Pupils are equal, round, and reactive to light.  Cardiovascular:     Rate and Rhythm: Normal rate and regular rhythm.  Pulmonary:     Effort: Pulmonary effort is normal.  Abdominal:     Palpations: Abdomen is soft.  Musculoskeletal:        General: Normal range of motion.     Cervical back: Normal range of motion and neck supple.     Comments: No signs of trauma on low back exam, no point tenderness  Skin:    General: Skin is warm and dry.     Capillary Refill: Capillary refill takes less than 2 seconds.  Neurological:     General: No focal deficit present.     Mental Status: She is alert and oriented to person, place, and time.     Motor: No abnormal muscle tone.     Coordination: Coordination normal.     Comments: Normal gait no neurological deficit noted  Psychiatric:        Behavior: Behavior normal.  Thought Content: Thought content normal.     ED Results / Procedures / Treatments   Labs (all labs ordered are listed, but only abnormal results are displayed) Labs Reviewed - No data to display  EKG None  Radiology No results found.  Procedures Procedures    Medications Ordered in ED Medications  oxyCODONE -acetaminophen  (PERCOCET/ROXICET) 5-325 MG per tablet 1 tablet (1 tablet Oral Given 04/04/24 1046)  ibuprofen (ADVIL) tablet 400 mg (400 mg Oral Given 04/04/24 1046)    ED Course/ Medical Decision Making/ A&P                                 Medical Decision Making Risk Prescription drug management.   58 year old female comes in today with atraumatic low back pain that appears to be acute superimposed on chronic.  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  Pain is in the low back area without radiation or neurological symptoms.  She denies  loss of bowel or bladder control.  She has been on Neurontin in the past and states that this helps.  She has been taking ibuprofen at home.  She received Percocet here in the ED. No red flags on exam. Patient advised regarding need for follow-up Will give short course of Neurontin and Flexeril. Patient advised of return precautions.        Final Clinical Impression(s) / ED Diagnoses Final diagnoses:  Chronic midline low back pain without sciatica    Rx / DC Orders ED Discharge Orders          Ordered    cyclobenzaprine (FLEXERIL) 10 MG tablet  2 times daily PRN        04/04/24 1256    gabapentin (NEURONTIN) 300 MG capsule  3 times daily        04/04/24 1256              Auston Blush, MD 04/04/24 1258

## 2024-04-04 NOTE — ED Triage Notes (Signed)
 Reports hx of DDD and has flare ups in her lower back.  Reports it flared up 3 days ago and not getting better.

## 2024-04-04 NOTE — ED Notes (Signed)
 Patient verbalizes understanding of discharge instructions. Opportunity for questioning and answers were provided. Pt discharged from ED.

## 2024-04-18 DIAGNOSIS — Z419 Encounter for procedure for purposes other than remedying health state, unspecified: Secondary | ICD-10-CM | POA: Diagnosis not present

## 2024-05-04 DIAGNOSIS — H5213 Myopia, bilateral: Secondary | ICD-10-CM | POA: Diagnosis not present

## 2024-05-18 DIAGNOSIS — Z419 Encounter for procedure for purposes other than remedying health state, unspecified: Secondary | ICD-10-CM | POA: Diagnosis not present

## 2024-05-21 ENCOUNTER — Encounter

## 2024-06-18 DIAGNOSIS — Z419 Encounter for procedure for purposes other than remedying health state, unspecified: Secondary | ICD-10-CM | POA: Diagnosis not present

## 2024-06-19 ENCOUNTER — Ambulatory Visit (INDEPENDENT_AMBULATORY_CARE_PROVIDER_SITE_OTHER)

## 2024-06-19 VITALS — BP 115/80 | HR 74 | Temp 97.3°F | Ht 66.0 in | Wt 115.0 lb

## 2024-06-19 DIAGNOSIS — Z0000 Encounter for general adult medical examination without abnormal findings: Secondary | ICD-10-CM | POA: Insufficient documentation

## 2024-06-19 DIAGNOSIS — Z13228 Encounter for screening for other metabolic disorders: Secondary | ICD-10-CM | POA: Diagnosis not present

## 2024-06-19 DIAGNOSIS — F411 Generalized anxiety disorder: Secondary | ICD-10-CM | POA: Diagnosis not present

## 2024-06-19 DIAGNOSIS — Z1159 Encounter for screening for other viral diseases: Secondary | ICD-10-CM

## 2024-06-19 DIAGNOSIS — F332 Major depressive disorder, recurrent severe without psychotic features: Secondary | ICD-10-CM

## 2024-06-19 DIAGNOSIS — Z1329 Encounter for screening for other suspected endocrine disorder: Secondary | ICD-10-CM

## 2024-06-19 DIAGNOSIS — Z130 Encounter for screening for diseases of the blood and blood-forming organs and certain disorders involving the immune mechanism: Secondary | ICD-10-CM

## 2024-06-19 DIAGNOSIS — F329 Major depressive disorder, single episode, unspecified: Secondary | ICD-10-CM | POA: Insufficient documentation

## 2024-06-19 DIAGNOSIS — Z1321 Encounter for screening for nutritional disorder: Secondary | ICD-10-CM | POA: Diagnosis not present

## 2024-06-19 DIAGNOSIS — F17200 Nicotine dependence, unspecified, uncomplicated: Secondary | ICD-10-CM | POA: Insufficient documentation

## 2024-06-19 MED ORDER — BUPROPION HCL ER (SR) 200 MG PO TB12: 200.0000 mg | ORAL_TABLET | Freq: Two times a day (BID) | ORAL | 2 refills | Status: DC

## 2024-06-19 MED ORDER — HYDROXYZINE PAMOATE 25 MG PO CAPS: 25.0000 mg | ORAL_CAPSULE | Freq: Three times a day (TID) | ORAL | 2 refills | Status: DC | PRN

## 2024-06-19 MED ORDER — BUSPIRONE HCL 5 MG PO TABS: 5.0000 mg | ORAL_TABLET | Freq: Two times a day (BID) | ORAL | 2 refills | Status: DC

## 2024-06-19 NOTE — Progress Notes (Deleted)
 New Patient Office Visit  Subjective    Patient ID: Kristine Griffith, female    DOB: 1966-04-14  Age: 58 y.o. MRN: [id number]  CC:  Chief Complaint  Patient presents with   New Patient (Initial Visit)   History of Present Illness     HPI Kristine Griffith presents to establish care. Prior PCP was T . Last physical was about 1 year ago. she notes that she requires refills of ***.  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   buPROPion  (WELLBUTRIN  SR) 200 MG 12 hr tablet Take 1 tablet (200 mg total) by mouth 2 (two) times daily.   busPIRone  (BUSPAR ) 5 MG tablet Take 1 tablet (5 mg total) by mouth 2 (two) times daily.   cefUROXime (CEFTIN) 500 MG tablet Take 500 mg by mouth.   cyclobenzaprine  (FLEXERIL ) 10 MG tablet Take 1 tablet (10 mg total) by mouth 2 (two) times daily as needed for muscle spasms.   gabapentin  (NEURONTIN ) 300 MG capsule Take 1 capsule (300 mg total) by mouth 3 (three) times daily.   hydrOXYzine  (VISTARIL ) 25 MG capsule Take 1 capsule (25 mg total) by mouth every 8 (eight) hours as needed for anxiety.   oxyCODONE -acetaminophen  (PERCOCET/ROXICET) 5-325 MG tablet Take 1 tablet by mouth every 6 (six) hours as needed for severe pain (pain score 7-10).   [DISCONTINUED] buPROPion  (WELLBUTRIN  SR) 200 MG 12 hr tablet Take 200 mg by mouth.   No facility-administered encounter medications on file as of 06/19/2024.    Past Medical History:  Diagnosis Date   Anxiety    Chest pain    Depression    Frequent headaches    Memory  loss    Palpitation     History reviewed. No pertinent surgical history.  Family History  Problem Relation Age of Onset   Depression Mother    Stroke Father    Hypertension Father    Hyperlipidemia Father    Diabetes Father     Social History   Socioeconomic History   Marital status: Married    Spouse name: Not on file   Number of children: 4   Years of education: Not on file   Highest education level: Not on file  Occupational History   Not on file  Tobacco Use   Smoking status: Every Day    Current packs/day: 1.00    Types: Cigarettes   Smokeless tobacco: Not on file  Vaping Use   Vaping status: Never Used  Substance and Sexual Activity   Alcohol use: Never   Drug use: Yes    Types: Marijuana   Sexual activity: Not Currently  Other Topics Concern   Not on file  Social History Narrative   Not on file   Social Drivers of Health   Financial Resource Strain: Not on file  Food Insecurity: Food Insecurity Present (06/19/2024)   Hunger Vital Sign    Worried About Running Out of Food in the Last Year: Often true    Ran Out of Food in the  Last Year: Sometimes true  Transportation Needs: No Transportation Needs (06/19/2024)   PRAPARE - Administrator, Civil Service (Medical): No    Lack of Transportation (Non-Medical): No  Physical Activity: Not on file  Stress: Not on file  Social Connections: Not on file  Intimate Partner Violence: Not At Risk (06/19/2024)   Humiliation, Afraid, Rape, and Kick questionnaire    Fear of Current or Ex-Partner: No    Emotionally Abused: No    Physically Abused: No    Sexually Abused: No    ROS  Per HPI      Objective    BP 115/80   Pulse 74   Temp (!) 97.3 F (36.3 C) (Oral)   Ht 5' 6 (1.676 m)   Wt 115 lb (52.2 kg)   SpO2 98%   BMI 18.56 kg/m   Physical Exam  {Labs (Optional):23779}    Assessment & Plan:   Generalized anxiety disorder -     Ambulatory referral to Psychiatry -     Ambulatory  referral to Psychology  Severe episode of recurrent major depressive disorder, without psychotic features (HCC) -     Ambulatory referral to Psychiatry -     Ambulatory referral to Psychology  Screening for endocrine, nutritional, metabolic and immunity disorder -     VITAMIN D  25 Hydroxy (Vit-D Deficiency, Fractures); Future -     CBC with Differential/Platelet; Future -     Hemoglobin A1c; Future -     Comprehensive metabolic panel with GFR; Future -     Lipid panel; Future -     TSH; Future  Screening for viral disease -     HIV Antibody (routine testing w rflx); Future -     Hepatitis C antibody; Future  Other orders -     buPROPion  HCl ER (SR); Take 1 tablet (200 mg total) by mouth 2 (two) times daily.  Dispense: 180 tablet; Refill: 2 -     busPIRone  HCl; Take 1 tablet (5 mg total) by mouth 2 (two) times daily.  Dispense: 120 tablet; Refill: 2 -     hydrOXYzine  Pamoate; Take 1 capsule (25 mg total) by mouth every 8 (eight) hours as needed for anxiety.  Dispense: 30 capsule; Refill: 2    Assessment and Plan Assessment & Plan      Return in about 6 weeks (around 07/31/2024) for Go over labs, discuss screenings, mood follow up.   Saddie JULIANNA Sacks, PA-C
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
## 2024-06-19 NOTE — Assessment & Plan Note (Signed)
 Chronic tobacco use, half a pack per day for 40 years. Interested in quitting. - Discuss smoking cessation strategies in future appointments.

## 2024-06-19 NOTE — Assessment & Plan Note (Signed)
 Chronic anxiety with episodes of palpitations and hot flashes. Limited success with past medications. Discussed non-habit forming options. - Prescribe Buspar  5 mg twice daily with food. - Prescribe hydroxyzine  as needed for acute anxiety attacks.

## 2024-06-19 NOTE — Patient Instructions (Signed)
 VISIT SUMMARY: Today, you were seen for mental health and medication management. We discussed your ongoing symptoms of depression, PTSD, and ADHD, as well as your current stressors and medication history. We also talked about your interest in trying new medications for anxiety and your desire to quit smoking. Additionally, we reviewed your preventive health screenings and family history of heart disease.  YOUR PLAN: -MAJOR DEPRESSIVE DISORDER AND POST-TRAUMATIC STRESS DISORDER (PTSD): You have chronic depression and PTSD, which are long-term mental health conditions often related to past trauma and current stressors. We will refill your Wellbutrin  at 200 mg twice daily and refer you to behavioral health for comprehensive management and potential gene site testing to tailor your medication regimen.  -ANXIETY DISORDER: Anxiety disorder involves excessive worry and physical symptoms like palpitations and hot flashes. We will start you on Buspar  5 mg twice daily with food and prescribe hydroxyzine  to use as needed for acute anxiety attacks.  INSTRUCTIONS: Please follow up with behavioral health for comprehensive management and potential gene site testing. Continue taking Wellbutrin  200 mg twice daily as prescribed. Start taking Buspar  5 mg twice daily with food and use hydroxyzine  as needed for acute anxiety attacks. We will discuss smoking cessation strategies in future appointments. If you have any questions or concerns, please do not hesitate to contact our office.  If you have any problems before your next visit feel free to message me via MyChart (minor issues or questions) or call the office, otherwise you may reach out to schedule an office visit.  Thank you! Saddie Sacks, PA-C

## 2024-06-19 NOTE — Assessment & Plan Note (Addendum)
 Chronic depression and PTSD with childhood trauma and recent caregiving stress. On Wellbutrin  150 mg due to supply issues. Past adverse effects with lithium and other medications. Discussed gene site testing for tailored medication regimen. - Refill Wellbutrin  200 mg twice daily. - Refer to behavioral health for comprehensive management and potential GeneSight testing.

## 2024-06-19 NOTE — Assessment & Plan Note (Signed)
 Labs today, see orders. Will go over with patient at next visit in 6 weeks.  Wil plan to also go over health screenings at that time to include colonoscopy, pap smear, mammogram, lung cancer screening.

## 2024-06-25 ENCOUNTER — Other Ambulatory Visit

## 2024-06-25 DIAGNOSIS — Z13228 Encounter for screening for other metabolic disorders: Secondary | ICD-10-CM

## 2024-06-25 DIAGNOSIS — Z1159 Encounter for screening for other viral diseases: Secondary | ICD-10-CM

## 2024-06-26 ENCOUNTER — Ambulatory Visit: Payer: Self-pay

## 2024-06-26 LAB — CBC WITH DIFFERENTIAL/PLATELET
Basophils Absolute: 0.1 x10E3/uL (ref 0.0–0.2)
Basos: 1 %
EOS (ABSOLUTE): 0.2 x10E3/uL (ref 0.0–0.4)
Eos: 3 %
Hematocrit: 45.5 % (ref 34.0–46.6)
Hemoglobin: 15.2 g/dL (ref 11.1–15.9)
Immature Grans (Abs): 0 x10E3/uL (ref 0.0–0.1)
Immature Granulocytes: 0 %
Lymphocytes Absolute: 2 x10E3/uL (ref 0.7–3.1)
Lymphs: 27 %
MCH: 32.1 pg (ref 26.6–33.0)
MCHC: 33.4 g/dL (ref 31.5–35.7)
MCV: 96 fL (ref 79–97)
Monocytes Absolute: 0.4 x10E3/uL (ref 0.1–0.9)
Monocytes: 6 %
Neutrophils Absolute: 4.5 x10E3/uL (ref 1.4–7.0)
Neutrophils: 63 %
Platelets: 206 x10E3/uL (ref 150–450)
RBC: 4.74 x10E6/uL (ref 3.77–5.28)
RDW: 13.5 % (ref 11.7–15.4)
WBC: 7.2 x10E3/uL (ref 3.4–10.8)

## 2024-06-26 LAB — COMPREHENSIVE METABOLIC PANEL WITH GFR
ALT: 16 IU/L (ref 0–32)
AST: 19 IU/L (ref 0–40)
Albumin: 4.9 g/dL (ref 3.8–4.9)
Alkaline Phosphatase: 84 IU/L (ref 44–121)
BUN/Creatinine Ratio: 14 (ref 9–23)
BUN: 12 mg/dL (ref 6–24)
Bilirubin Total: 0.5 mg/dL (ref 0.0–1.2)
CO2: 22 mmol/L (ref 20–29)
Calcium: 9.9 mg/dL (ref 8.7–10.2)
Chloride: 104 mmol/L (ref 96–106)
Creatinine, Ser: 0.87 mg/dL (ref 0.57–1.00)
Globulin, Total: 2.3 g/dL (ref 1.5–4.5)
Glucose: 74 mg/dL (ref 70–99)
Potassium: 4.4 mmol/L (ref 3.5–5.2)
Sodium: 142 mmol/L (ref 134–144)
Total Protein: 7.2 g/dL (ref 6.0–8.5)
eGFR: 77 mL/min/1.73 (ref >59)

## 2024-06-26 LAB — LIPID PANEL
Chol/HDL Ratio: 3 ratio (ref 0.0–4.4)
Cholesterol, Total: 187 mg/dL (ref 100–199)
HDL: 63 mg/dL (ref >39)
LDL Chol Calc (NIH): 115 mg/dL — ABNORMAL HIGH (ref 0–99)
Triglycerides: 45 mg/dL (ref 0–149)
VLDL Cholesterol Cal: 9 mg/dL (ref 5–40)

## 2024-06-26 LAB — HEMOGLOBIN A1C
Est. average glucose Bld gHb Est-mCnc: 126 mg/dL
Hgb A1c MFr Bld: 6 % — ABNORMAL HIGH (ref 4.8–5.6)

## 2024-06-26 LAB — VITAMIN D 25 HYDROXY (VIT D DEFICIENCY, FRACTURES): Vit D, 25-Hydroxy: 42.5 ng/mL (ref 30.0–100.0)

## 2024-06-26 LAB — HIV ANTIBODY (ROUTINE TESTING W REFLEX): HIV Screen 4th Generation wRfx: NONREACTIVE

## 2024-06-26 LAB — TSH: TSH: 1.78 u[IU]/mL (ref 0.450–4.500)

## 2024-06-26 LAB — HEPATITIS C ANTIBODY: Hep C Virus Ab: NONREACTIVE

## 2024-07-19 DIAGNOSIS — Z419 Encounter for procedure for purposes other than remedying health state, unspecified: Secondary | ICD-10-CM | POA: Diagnosis not present

## 2024-07-31 ENCOUNTER — Ambulatory Visit (INDEPENDENT_AMBULATORY_CARE_PROVIDER_SITE_OTHER)

## 2024-07-31 ENCOUNTER — Telehealth: Payer: Self-pay | Admitting: *Deleted

## 2024-07-31 VITALS — BP 122/64 | HR 74 | Ht 66.0 in | Wt 113.1 lb

## 2024-07-31 DIAGNOSIS — Z1231 Encounter for screening mammogram for malignant neoplasm of breast: Secondary | ICD-10-CM | POA: Diagnosis not present

## 2024-07-31 DIAGNOSIS — F329 Major depressive disorder, single episode, unspecified: Secondary | ICD-10-CM

## 2024-07-31 DIAGNOSIS — E782 Mixed hyperlipidemia: Secondary | ICD-10-CM | POA: Diagnosis not present

## 2024-07-31 DIAGNOSIS — Z23 Encounter for immunization: Secondary | ICD-10-CM

## 2024-07-31 DIAGNOSIS — Z0000 Encounter for general adult medical examination without abnormal findings: Secondary | ICD-10-CM

## 2024-07-31 DIAGNOSIS — Z122 Encounter for screening for malignant neoplasm of respiratory organs: Secondary | ICD-10-CM

## 2024-07-31 DIAGNOSIS — E785 Hyperlipidemia, unspecified: Secondary | ICD-10-CM | POA: Insufficient documentation

## 2024-07-31 DIAGNOSIS — G629 Polyneuropathy, unspecified: Secondary | ICD-10-CM | POA: Insufficient documentation

## 2024-07-31 DIAGNOSIS — F411 Generalized anxiety disorder: Secondary | ICD-10-CM

## 2024-07-31 DIAGNOSIS — F17200 Nicotine dependence, unspecified, uncomplicated: Secondary | ICD-10-CM

## 2024-07-31 MED ORDER — COVID-19 MRNA VAC-TRIS(PFIZER) 30 MCG/0.3ML IM SUSY: 0.3000 mL | PREFILLED_SYRINGE | Freq: Once | INTRAMUSCULAR | 0 refills | Status: AC

## 2024-07-31 MED ORDER — GABAPENTIN 300 MG PO CAPS: 300.0000 mg | ORAL_CAPSULE | Freq: Three times a day (TID) | ORAL | 2 refills | Status: AC

## 2024-07-31 MED ORDER — CYCLOBENZAPRINE HCL 10 MG PO TABS: 10.0000 mg | ORAL_TABLET | Freq: Two times a day (BID) | ORAL | 0 refills | Status: AC | PRN

## 2024-07-31 MED ORDER — ATORVASTATIN CALCIUM 40 MG PO TABS: 40.0000 mg | ORAL_TABLET | Freq: Every day | ORAL | 1 refills | Status: AC

## 2024-07-31 MED ORDER — BUSPIRONE HCL 10 MG PO TABS: 10.0000 mg | ORAL_TABLET | Freq: Two times a day (BID) | ORAL | 2 refills | Status: DC

## 2024-07-31 MED ORDER — HYDROXYZINE PAMOATE 100 MG PO CAPS: 100.0000 mg | ORAL_CAPSULE | Freq: Three times a day (TID) | ORAL | 1 refills | Status: AC | PRN

## 2024-07-31 NOTE — Addendum Note (Signed)
 Addended byBETHA GAYLE NUMBERS on: 07/31/2024 07:44 PM   Modules accepted: Orders

## 2024-07-31 NOTE — Progress Notes (Signed)
 Established Patient Office Visit  Subjective   Patient ID: Kristine Griffith, female    DOB: Jun 19, 1966  Age: 58 y.o. MRN: [id number]  Chief Complaint  Patient presents with   Medical Management of Chronic Issues    HPI  History of Present Illness   Kristine Griffith is a 58 year old female with depression and anxiety who presents for medication management and mood stabilization.  Mood disturbance and anxiety - Depression and anxiety present. - Currently taking bupropion  200 mg twice daily with perceived benefit for mood stabilization. - Buspirone  taken twice daily for anxiety, but ineffective; no side effects experienced. - Hydroxyzine  prescribed for anxiety, but no effect even at double dose.  Neuropathic symptoms and gabapentin  use - Gabapentin  300 mg once daily, but inconsistent use due to lack of personal supply. - No perceived need for higher dosing frequency, even at increased doses.  Hyperlipidemia - Concern about cholesterol levels. - Out of atorvastatin , wishes to resume previous dose of 40 mg daily. - Family history of poor circulation and hypercholesterolemia.  Tobacco use - Attempting smoking cessation. - Currently a pack of cigarettes lasts one week. - Attributes some reduction in smoking to bupropion  use.  Medication and supplement management - Taking various vitamins and supplements, including iron and collagen. - Concerned about potential interactions with psychiatric medications. - Interested in medication affordability and considering financial assistance from family.     ROS Per HPI.    Objective:     BP 122/64   Pulse 74   Ht 5' 6 (1.676 m)   Wt 113 lb 1.3 oz (51.3 kg)   SpO2 98%   BMI 18.25 kg/m    Physical Exam Constitutional:      General: She is not in acute distress.    Appearance: Normal appearance.  Cardiovascular:     Rate and Rhythm: Normal rate and regular rhythm.     Heart sounds: Normal heart sounds. No murmur  heard.    No friction rub. No gallop.  Pulmonary:     Effort: Pulmonary effort is normal. No respiratory distress.     Breath sounds: Normal breath sounds.  Musculoskeletal:        General: No swelling.  Skin:    General: Skin is warm and dry.  Neurological:     General: No focal deficit present.     Mental Status: She is alert.  Psychiatric:        Mood and Affect: Mood normal.        Behavior: Behavior normal.        Thought Content: Thought content normal.     No results found for any visits on 07/31/24.  Last CBC Lab Results  Component Value Date   WBC 7.2 06/25/2024   HGB 15.2 06/25/2024   HCT 45.5 06/25/2024   MCV 96 06/25/2024   MCH 32.1 06/25/2024   RDW 13.5 06/25/2024   PLT 206 06/25/2024   Last metabolic panel Lab Results  Component Value Date   GLUCOSE 74 06/25/2024   NA 142 06/25/2024   K 4.4 06/25/2024   CL 104 06/25/2024   CO2 22 06/25/2024   BUN 12 06/25/2024   CREATININE 0.87 06/25/2024   EGFR 77 06/25/2024   CALCIUM  9.9 06/25/2024   PROT 7.2 06/25/2024   ALBUMIN 4.9 06/25/2024   LABGLOB 2.3 06/25/2024   BILITOT 0.5 06/25/2024   ALKPHOS 84 06/25/2024   AST 19 06/25/2024   ALT 16 06/25/2024   ANIONGAP 14 01/03/2024  Last lipids Lab Results  Component Value Date   CHOL 187 06/25/2024   HDL 63 06/25/2024   LDLCALC 115 (H) 06/25/2024   TRIG 45 06/25/2024   CHOLHDL 3.0 06/25/2024   Last hemoglobin A1c Lab Results  Component Value Date   HGBA1C 6.0 (H) 06/25/2024   Last thyroid functions Lab Results  Component Value Date   TSH 1.780 06/25/2024   Last vitamin D  Lab Results  Component Value Date   VD25OH 42.5 06/25/2024      The 10-year ASCVD risk score (Arnett DK, et al., 2019) is: 6.2%    Assessment & Plan:   Screening mammogram for breast cancer -     3D Screening Mammogram, Left and Right; Future  Screening for lung cancer -     CT CHEST LUNG CANCER SCREENING LOW DOSE WO CONTRAST; Future  Encounter for  vaccination -     Flu vaccine trivalent PF, 6mos and older(Flulaval,Afluria,Fluarix,Fluzone) -     Tdap vaccine greater than or equal to 7yo IM -     COVID-19 mRNA Vac-TriS(Pfizer); Inject 0.3 mLs into the muscle once for 1 dose.  Dispense: 0.3 mL; Refill: 0  Tobacco use disorder Assessment & Plan: Smoking about 1/2 ppd. Patient does believe the Wellbutrin  is helping her to reduce her use/reduce cravings. Continue Wellbutrin  300 mg XL daily. Lung cancer screening ordered today.  Orders: -     COVID-19 mRNA Vac-TriS(Pfizer); Inject 0.3 mLs into the muscle once for 1 dose.  Dispense: 0.3 mL; Refill: 0  General medical exam Assessment & Plan: Went over lab results in detail. Discussed health screenings to include mammogram and lung cancer screening. TDAP and flu vaccine administered today. Provided with COVID vaccine prescription.    Generalized anxiety disorder Assessment & Plan: Improving clinically. Will increase Buspar  to 10 mg BID and hydroxyzine  100 mg up to TID as needed. If hydroxyzine  proves ineffective at this dose, consider a low dose of Klonopin or Alprazolam for acute anxiety.   Major depressive disorder with current active episode, unspecified depression episode severity, unspecified whether recurrent Assessment & Plan: Doing well on the Wellbutrin  200 mg twice daily. Will continue this as long as patient tolerating well. Mood improving on this medication.  Patient does not feel that she needs to see behavioral health at this time since her mood and anxiety are clinically improving but is open to going to see them if needed if this current medication regimen proves ineffective in the future.   Moderate mixed hyperlipidemia not requiring statin therapy Assessment & Plan: Last lipid panel: LDL 115, HDL 63, Trig 45. The 10-year ASCVD risk score (Arnett DK, et al., 2019) is: 6.2% Patient wants to restart her atorvastatin  40 mg as she has been out for a long time. Will refill  today and recheck lipid and CMP in 3 months.   Neuropathy Assessment & Plan: Refill gabapentin  300 mg up to 3 times daily as needed for neuropathy. This works well for her and keeps her pain well controlled.   Other orders -     busPIRone  HCl; Take 1 tablet (10 mg total) by mouth 2 (two) times daily.  Dispense: 180 tablet; Refill: 2 -     hydrOXYzine  Pamoate; Take 1 capsule (100 mg total) by mouth 3 (three) times daily as needed for itching.  Dispense: 90 capsule; Refill: 1 -     Gabapentin ; Take 1 capsule (300 mg total) by mouth 3 (three) times daily.  Dispense: 90 capsule; Refill: 2 -  Atorvastatin  Calcium ; Take 1 tablet (40 mg total) by mouth daily.  Dispense: 90 tablet; Refill: 1 -     Cyclobenzaprine  HCl; Take 1 tablet (10 mg total) by mouth 2 (two) times daily as needed for muscle spasms.  Dispense: 20 tablet; Refill: 0     Return in about 4 weeks (around 08/28/2024) for Mood, HLD.    Saddie JULIANNA Sacks, PA-C

## 2024-07-31 NOTE — Assessment & Plan Note (Signed)
 Went over lab results in detail. Discussed health screenings to include mammogram and lung cancer screening. TDAP and flu vaccine administered today. Provided with COVID vaccine prescription.

## 2024-07-31 NOTE — Assessment & Plan Note (Signed)
 Refill gabapentin  300 mg up to 3 times daily as needed for neuropathy. This works well for her and keeps her pain well controlled.

## 2024-07-31 NOTE — Assessment & Plan Note (Signed)
 Improving clinically. Will increase Buspar  to 10 mg BID and hydroxyzine  100 mg up to TID as needed. If hydroxyzine  proves ineffective at this dose, consider a low dose of Klonopin or Alprazolam for acute anxiety.

## 2024-07-31 NOTE — Telephone Encounter (Signed)
 If patient does not qualify for the lung cancer screening, then there is no other reason we need the chest CT so I will just cancel the order. Her chart was prompting me that she met the criteria, which is why I ordered it.

## 2024-07-31 NOTE — Assessment & Plan Note (Signed)
 Smoking about 1/2 ppd. Patient does believe the Wellbutrin  is helping her to reduce her use/reduce cravings. Continue Wellbutrin  300 mg XL daily. Lung cancer screening ordered today.

## 2024-07-31 NOTE — Telephone Encounter (Signed)
 Copied from CRM #[id number]. Topic: Clinical - Request for Lab/Test Order >> Jul 31, 2024 11:53 AM Kristine Griffith wrote: Reason for CRM: [name] with [facility]- need CT order changed from cancer screening to CT chest without- does not qualify for cancer screening- please call [phone] opt 1 then opt 4

## 2024-07-31 NOTE — Assessment & Plan Note (Signed)
 Doing well on the Wellbutrin  200 mg twice daily. Will continue this as long as patient tolerating well. Mood improving on this medication.  Patient does not feel that she needs to see behavioral health at this time since her mood and anxiety are clinically improving but is open to going to see them if needed if this current medication regimen proves ineffective in the future.

## 2024-07-31 NOTE — Patient Instructions (Addendum)
 It was nice to see you today!  As we discussed in clinic:  -Continue your Wellbutrin  200 mg twice a day as prescribed. - I have increased your BuSpar  from 5 mg to 10 mg twice a day. - We have also increased your hydroxyzine  to 100 mg as needed for acute anxiety and sleep. - I have placed referrals for your mammogram and lung cancer screening.  Both of these will be at [facility] imaging and they will call you to get this appointment set up. - We are updating your tetanus and your flu vaccines today. - I have also provided you with a prescription for a COVID-vaccine that you can present to the pharmacist.  - For the buspirone  5 mg tablets that you have left, you can take 2 of twice a day for total of 10 mg until you run out. - You can also take 2 of the hydroxyzine  25 mg that you have until you run out. Both of the updated doses are waiting for you at [facility].  I will see back in 4 weeks for a follow-up.  It was good to see you!  If you have any problems before your next visit feel free to message me via MyChart (minor issues or questions) or call the office, otherwise you may reach out to schedule an office visit.  Thank you! Saddie Sacks, PA-C

## 2024-07-31 NOTE — Assessment & Plan Note (Signed)
 Last lipid panel: LDL 115, HDL 63, Trig 45. The 10-year ASCVD risk score (Arnett DK, et al., 2019) is: 6.2% Patient wants to restart her atorvastatin  40 mg as she has been out for a long time. Will refill today and recheck lipid and CMP in 3 months.

## 2024-08-06 ENCOUNTER — Other Ambulatory Visit: Payer: Self-pay

## 2024-08-06 DIAGNOSIS — Z122 Encounter for screening for malignant neoplasm of respiratory organs: Secondary | ICD-10-CM

## 2024-08-14 ENCOUNTER — Ambulatory Visit: Admission: RE | Admit: 2024-08-14 | Discharge: 2024-08-14 | Disposition: A | Source: Ambulatory Visit

## 2024-08-14 DIAGNOSIS — Z1231 Encounter for screening mammogram for malignant neoplasm of breast: Secondary | ICD-10-CM

## 2024-08-16 ENCOUNTER — Other Ambulatory Visit: Payer: Self-pay

## 2024-08-16 ENCOUNTER — Telehealth: Payer: Self-pay | Admitting: *Deleted

## 2024-08-16 DIAGNOSIS — R928 Other abnormal and inconclusive findings on diagnostic imaging of breast: Secondary | ICD-10-CM

## 2024-08-16 NOTE — Telephone Encounter (Signed)
 Contacted pt to inform her that I do not see the images from her previous mammogram.  Told her that the only thing we do have is the documentation of the findings.  Told her to contact the office that completed the mammogram to see if she could get the images sent to her.

## 2024-08-16 NOTE — Telephone Encounter (Signed)
 Copied from CRM [id number]. Topic: Medical Record Request - Records Request >> Aug 16, 2024 10:44 AM Kristine Griffith wrote: Reason for CRM: Patient needs imaging that she had done in [place] and she is unaware of how to get them//Please contact the patient she is very upset

## 2024-08-18 DIAGNOSIS — Z419 Encounter for procedure for purposes other than remedying health state, unspecified: Secondary | ICD-10-CM | POA: Diagnosis not present

## 2024-08-28 ENCOUNTER — Ambulatory Visit (INDEPENDENT_AMBULATORY_CARE_PROVIDER_SITE_OTHER)

## 2024-08-28 VITALS — BP 109/74 | HR 70 | Temp 97.5°F | Ht 66.0 in | Wt 124.0 lb

## 2024-08-28 DIAGNOSIS — F332 Major depressive disorder, recurrent severe without psychotic features: Secondary | ICD-10-CM

## 2024-08-28 DIAGNOSIS — F411 Generalized anxiety disorder: Secondary | ICD-10-CM | POA: Diagnosis not present

## 2024-08-28 DIAGNOSIS — Z59869 Financial insecurity, unspecified: Secondary | ICD-10-CM | POA: Diagnosis not present

## 2024-08-28 NOTE — Assessment & Plan Note (Signed)
 Improving clinically. Will increase Buspar  to 10 mg BID and hydroxyzine  100 mg up to TID as needed. If hydroxyzine  proves ineffective at this dose, consider a low dose of Klonopin or Alprazolam for acute anxiety.

## 2024-08-28 NOTE — Patient Instructions (Signed)
 VISIT SUMMARY: Today, we discussed your mental health concerns, including PTSD and ADHD, and addressed issues related to medication management and financial strain. We also talked about your current efforts to file for disability and the impact of these conditions on your daily life and employment.  YOUR PLAN: POST-TRAUMATIC STRESS DISORDER (PTSD): Your PTSD symptoms have worsened due to recent interactions with your estranged sister, bringing up past trauma. -We are referring you to [facility] for a psychiatric evaluation and management. -We encourage you to participate in therapy to help manage your trauma and emotional distress.  MAJOR DEPRESSIVE DISORDER: Your depression is being made worse by financial strain and family stressors, which is also affecting your disability filing and employment. -Continue taking your current medications, including Buspar , and make sure to follow the prescribed regimen. -We will coordinate with [facility] to help you with financial assistance and medication support. -We encourage you to participate in therapy for ongoing support and to help manage your depressive symptoms.  FINANCIAL STRAIN IMPACTING MEDICATION ADHERENCE: Financial difficulties are making it hard for you to afford your medications, even with Medicaid coverage. -We are referring you to [facility] for help with medication costs and financial support. -Explore job opportunities and Neurosurgeon through [employer].  If you have any problems before your next visit feel free to message me via MyChart (minor issues or questions) or call the office, otherwise you may reach out to schedule an office visit.  Thank you! Saddie Sacks, PA-C

## 2024-08-28 NOTE — Assessment & Plan Note (Signed)
 PHQ-9 greatly improved.  - Continue current medication regimen of Buspar , hydroxyzine , and Wellbutrin . - Coordinate with [facility] for financial assistance and medication assistance/support. - Encourage therapy for ongoing support and depressive symptom management. Have advised her to ask the psychology office to mail her the new patient forms so that she can fill them out with no required use of cell phone or reliance on transportation to their office to fill out in person.

## 2024-08-28 NOTE — Progress Notes (Signed)
 Established Patient Office Visit  Subjective   Patient ID: Kristine Griffith, female    DOB: 1966-07-29  Age: 58 y.o. MRN: [id number]  Chief Complaint  Patient presents with   Medical Management of Chronic Issues    HPI  Kristine Griffith is a 58 year old female who presents for medication management.   Post-traumatic stress disorder symptoms - Exacerbation of PTSD symptoms triggered by recent interactions with estranged sister - Resurfacing of past trauma related to family history of incest - Difficulty managing symptoms, impacting daily functioning and ability to work full-time - Expresses need for counseling to address trauma and symptom exacerbation - Psychology office has been in contact with her to get appointment scheduled but patient is having a hard time filling out the paperwork on her phone. - Overall, does report that since the first time I met her 2 months ago, she is doing much better mentally and the medications we started her on have helped tremendously  Medication access and adherence - Financial strain limits ability to afford medications despite Medicaid coverage with $4 copay per prescription - Difficulty obtaining gabapentin  due to financial constraints  Functional impairment and disability - Currently in the process of filing for disability, with prolonged effort and attorney involvement - PTSD symptoms interfere with ability to maintain full-time employment - Has applied for various jobs, including at [employer], without receiving callbacks - Considering part-time work to better manage PTSD symptoms  Social support and community involvement - Relies on father for transportation and support, indicating a close relationship - Joined a choir for community involvement but has not attended due to transportation issues          ROS Per HPI.    Objective:     BP 109/74   Pulse 70   Temp (!) 97.5 F (36.4 C) (Oral)   Ht 5' 6 (1.676 m)   Wt 124 lb  0.6 oz (56.3 kg)   SpO2 97%   BMI 20.02 kg/m    Physical Exam Constitutional:      General: She is not in acute distress.    Appearance: Normal appearance.  Cardiovascular:     Rate and Rhythm: Normal rate and regular rhythm.     Heart sounds: Normal heart sounds. No murmur heard.    No friction rub. No gallop.  Pulmonary:     Effort: Pulmonary effort is normal. No respiratory distress.     Breath sounds: Normal breath sounds.  Musculoskeletal:        General: No swelling.  Skin:    General: Skin is warm and dry.  Neurological:     General: No focal deficit present.     Mental Status: She is alert.  Psychiatric:        Mood and Affect: Mood normal.        Behavior: Behavior normal.        Thought Content: Thought content normal.      No results found for any visits on 08/28/24.    The 10-year ASCVD risk score (Arnett DK, et al., 2019) is: 4.4%    Assessment & Plan:   Generalized anxiety disorder Assessment & Plan: Improving clinically. Will increase Buspar  to 10 mg BID and hydroxyzine  100 mg up to TID as needed. If hydroxyzine  proves ineffective at this dose, consider a low dose of Klonopin or Alprazolam for acute anxiety.   Orders: -     AMB Referral [facility] Care Management -     Ambulatory  referral to Psychiatry  Severe episode of recurrent major depressive disorder, without psychotic features [facility]) Assessment & Plan: PHQ-9 greatly improved.  - Continue current medication regimen of Buspar , hydroxyzine , and Wellbutrin . - Coordinate with [facility] for financial assistance and medication assistance/support. - Encourage therapy for ongoing support and depressive symptom management. Have advised her to ask the psychology office to mail her the new patient forms so that she can fill them out with no required use of cell phone or reliance on transportation to their office to fill out in person.   Orders: -     AMB Referral [facility] Care Management -      Ambulatory referral to Psychiatry  Financial insecurity Assessment & Plan: Financial strain affects medication affordability, with co-pays accumulating due to multiple prescriptions. - Refer to [facility] for medication cost assistance and financial support. - Explore job opportunities and Neurosurgeon through [employer].   Orders: -     AMB Referral [facility] Care Management    Return in about 8 weeks (around 10/23/2024) for Mood/Med management .    Saddie JULIANNA Sacks, PA-C

## 2024-08-28 NOTE — Assessment & Plan Note (Signed)
 Financial strain affects medication affordability, with co-pays accumulating due to multiple prescriptions. - Refer to [facility] for medication cost assistance and financial support. - Explore job opportunities and Neurosurgeon through [employer].

## 2024-09-02 ENCOUNTER — Telehealth: Payer: Self-pay | Admitting: *Deleted

## 2024-09-02 NOTE — Progress Notes (Unsigned)
 Care Guide Pharmacy Note  09/02/2024 Name: Kristine Griffith MRN: [id number] DOB: 11-Nov-1965  Referred By: Gayle Saddie FALCON, PA-C Reason for referral: Call Attempt #1 and Complex Care Management (Outreach to schedule referral with pharmacist )   Kristine Griffith is a 58 y.o. year old female who is a primary care patient of Gayle Saddie FALCON, [place].  Kristine Griffith was referred to the pharmacist for assistance related to: Depression  An unsuccessful telephone outreach was attempted today to contact the patient who was referred to the pharmacy team for assistance with medication assistance. Additional attempts will be made to contact the patient.  Thedford Franks, CMA [place]  [facility], [facility] Guide Direct Dial: [phone]  Fax: [phone] Website: [email or web address]

## 2024-09-03 NOTE — Progress Notes (Unsigned)
 Care Guide Pharmacy Note  09/03/2024 Name: Kristine Griffith MRN: [id number] DOB: 1966-11-02  Referred By: Gayle Saddie FALCON, PA-C Reason for referral: Call Attempt #1 and Complex Care Management (Outreach to schedule referral with pharmacist )   Kristine Griffith is a 58 y.o. year old female who is a primary care patient of Gayle Saddie FALCON, [place].  Kristine Griffith was referred to the pharmacist for assistance related to: Depression  A second unsuccessful telephone outreach was attempted today to contact the patient who was referred to the pharmacy team for assistance with medication assistance. Additional attempts will be made to contact the patient.  Thedford Franks, CMA [place]  [facility], [facility] Guide Direct Dial: [phone]  Fax: [phone] Website: [email or web address]

## 2024-09-04 NOTE — Progress Notes (Signed)
 Care Guide Pharmacy Note  09/04/2024 Name: Kristine Griffith MRN: [id number] DOB: [birth year]  Referred By: Gayle Saddie FALCON, PA-C Reason for referral: Call Attempt #1 and Complex Care Management (Outreach to schedule referral with pharmacist )   Kristine Griffith is a 58 y.o. year old female who is a primary care patient of Gayle Saddie FALCON, [place].  Kristine Griffith was referred to the pharmacist for assistance related to: Depression  A third unsuccessful telephone outreach was attempted today to contact the patient who was referred to the pharmacy team for assistance with medication management. The Population Health team is pleased to engage with this patient at any time in the future upon receipt of referral and should he/she be interested in assistance from the Population Health team.  Kristine Griffith, CMA [facility]  [facility], [facility] Guide Direct Dial: [phone]  Fax: [phone] Website: [email or web address]

## 2024-09-18 DIAGNOSIS — Z419 Encounter for procedure for purposes other than remedying health state, unspecified: Secondary | ICD-10-CM | POA: Diagnosis not present

## 2024-09-20 DIAGNOSIS — F331 Major depressive disorder, recurrent, moderate: Secondary | ICD-10-CM | POA: Diagnosis not present

## 2024-09-20 DIAGNOSIS — F4311 Post-traumatic stress disorder, acute: Secondary | ICD-10-CM | POA: Diagnosis not present

## 2024-09-20 DIAGNOSIS — F411 Generalized anxiety disorder: Secondary | ICD-10-CM | POA: Diagnosis not present

## 2024-10-16 ENCOUNTER — Ambulatory Visit: Admitting: Behavioral Health

## 2024-10-16 DIAGNOSIS — F411 Generalized anxiety disorder: Secondary | ICD-10-CM | POA: Diagnosis not present

## 2024-10-16 DIAGNOSIS — F4311 Post-traumatic stress disorder, acute: Secondary | ICD-10-CM | POA: Diagnosis not present

## 2024-10-16 DIAGNOSIS — F331 Major depressive disorder, recurrent, moderate: Secondary | ICD-10-CM | POA: Diagnosis not present

## 2024-10-20 ENCOUNTER — Other Ambulatory Visit: Payer: Self-pay | Admitting: Medical Genetics

## 2024-10-20 ENCOUNTER — Ambulatory Visit

## 2024-10-23 ENCOUNTER — Ambulatory Visit

## 2024-10-23 VITALS — BP 104/70 | HR 66 | Temp 98.2°F | Ht 66.0 in | Wt 122.1 lb

## 2024-10-23 DIAGNOSIS — F411 Generalized anxiety disorder: Secondary | ICD-10-CM

## 2024-10-23 DIAGNOSIS — E782 Mixed hyperlipidemia: Secondary | ICD-10-CM | POA: Diagnosis not present

## 2024-10-23 DIAGNOSIS — Z1211 Encounter for screening for malignant neoplasm of colon: Secondary | ICD-10-CM | POA: Diagnosis not present

## 2024-10-23 DIAGNOSIS — I471 Supraventricular tachycardia, unspecified: Secondary | ICD-10-CM | POA: Diagnosis not present

## 2024-10-23 DIAGNOSIS — G629 Polyneuropathy, unspecified: Secondary | ICD-10-CM | POA: Diagnosis not present

## 2024-10-23 DIAGNOSIS — Z0000 Encounter for general adult medical examination without abnormal findings: Secondary | ICD-10-CM | POA: Insufficient documentation

## 2024-10-23 DIAGNOSIS — F329 Major depressive disorder, single episode, unspecified: Secondary | ICD-10-CM | POA: Diagnosis not present

## 2024-10-23 MED ORDER — METOPROLOL SUCCINATE ER 25 MG PO TB24: 25.0000 mg | ORAL_TABLET | Freq: Every day | ORAL | 3 refills | Status: AC

## 2024-10-23 NOTE — Assessment & Plan Note (Signed)
 Continue Gabapentin  300 mg up to 3 times daily as needed for neuropathy. This works well for her and keeps her pain well controlled.

## 2024-10-23 NOTE — Assessment & Plan Note (Signed)
 PHQ-9 greatly improved.  - Continue current medication regimen of Buspar , hydroxyzine , and Wellbutrin . - Coordinate with [facility] for financial assistance and medication assistance/support. - Encourage therapy for ongoing support and depressive symptom management. Have advised her to ask the psychology office to mail her the new patient forms so that she can fill them out with no required use of cell phone or reliance on transportation to their office to fill out in person.

## 2024-10-23 NOTE — Assessment & Plan Note (Signed)
 Depression and anxiety continue to improve.  - Continue current medication regimen of Buspar , hydroxyzine , and Wellbutrin .  - Encourage therapy for ongoing support and depressive symptoms.  - Now following with [facility] for medication management.

## 2024-10-23 NOTE — Assessment & Plan Note (Signed)
 Approved for lung cancer screening and mammogram. Pap smear and colonoscopy due. Vaccines up to date. - Schedule lung cancer screening and mammogram at [facility]. - Plan Pap smear at next appointment. Patient declined doing it today.  - Plan colonoscopy screening.

## 2024-10-23 NOTE — Assessment & Plan Note (Signed)
 Continue atorvastatin  40 mg daily for now. Will recheck lipid panel before next appt in 3 months.

## 2024-10-23 NOTE — Assessment & Plan Note (Signed)
 Per chart review of outside records, patient has diagnosed history of SVT from [facility]. Previously rate controlled with Metoprolol  and following with a cardiologist in [place].  Given patient reports chest pain with palpitations occurring intermittently, will refer to cardiology for closer supervision, echo.  Restarted Metoprolol  25 mg XL for rate control. Discussed potential side effects.

## 2024-10-23 NOTE — Progress Notes (Signed)
 Established Patient Office Visit  Subjective   Patient ID: Kristine Griffith, female    DOB: 1966/08/18  Age: 57 y.o. MRN: [id number]  Chief Complaint  Patient presents with   Medical Management of Chronic Issues    HPI  Discussed the use of AI scribe software for clinical note transcription with the patient, who gave verbal consent to proceed.  History of Present Illness   Kristine Griffith is a 58 year old female who presents for routine follow up on mood.   Neck pain - Intermittent pain localized to the left side of the neck for the past three days - No neck pain present today - Concerned about possible cardiac or cerebrovascular etiology - Using natural remedies including banana water and okra water which help with the pain  -Using flexeril  as needed - Denies one sided vision changes, hearing loss, or unilateral weakness   Mood disturbances and fatigue - History of depression. Recently got established with [facility]  - Currently taking Wellbutrin , buspar , and hydroxyzine   - Difficulty obtaining approval for a mood stabilizer from her behavioral health specialist, which she believes would help with mood swings - Mood swings and crying spells resulting in feeling drained - Does report that she is doing the best she has been mentally in a while. Expresses hope that things will continue to improve now that she is following with psychiatry   Chest pain and cardiovascular symptoms - History of chest pain and shortness of breath, with an ER visit in 2023. Diagnosed with SVT and was put on metoprolol .  - Prescribed metoprolol  in the past when she was living in [place] to prevent SVT but has not been on this medication in over a year  - Denies current chest pain or palpitations but does report that she does experience these intermittently and they resolve on their own  - Concerned about heart health, especially given family history of cardiovascular disease (father  under cardiology care)  Medication management - Current medications: atorvastatin , gabapentin , trazodone for sleep, Wellbutrin   Cancer screening and preventive care - Approved for lung cancer screening and mammogram but has not scheduled appointments - [facility] told patient they needed past mammogram records to be able to do her diagnostic mammogram     ROS Per HPI.    Objective:     BP 104/70   Pulse 66   Temp 98.2 F (36.8 C) (Oral)   Ht 5' 6 (1.676 m)   Wt 122 lb 1.9 oz (55.4 kg)   SpO2 98%   BMI 19.71 kg/m    Physical Exam Constitutional:      General: She is not in acute distress.    Appearance: Normal appearance.  Neck:     Vascular: No carotid bruit.  Cardiovascular:     Rate and Rhythm: Normal rate and regular rhythm.     Heart sounds: Normal heart sounds. No murmur heard.    No friction rub. No gallop.  Pulmonary:     Effort: Pulmonary effort is normal. No respiratory distress.     Breath sounds: Normal breath sounds.  Musculoskeletal:        General: No swelling.     Cervical back: Neck supple.  Lymphadenopathy:     Cervical: No cervical adenopathy.  Skin:    General: Skin is warm and dry.  Neurological:     General: No focal deficit present.     Mental Status: She is alert.  Psychiatric:  Mood and Affect: Mood normal.        Behavior: Behavior normal.        Thought Content: Thought content normal.      No results found for any visits on 10/23/24.  Last CBC Lab Results  Component Value Date   WBC 7.2 06/25/2024   HGB 15.2 06/25/2024   HCT 45.5 06/25/2024   MCV 96 06/25/2024   MCH 32.1 06/25/2024   RDW 13.5 06/25/2024   PLT 206 06/25/2024   Last metabolic panel Lab Results  Component Value Date   GLUCOSE 74 06/25/2024   NA 142 06/25/2024   K 4.4 06/25/2024   CL 104 06/25/2024   CO2 22 06/25/2024   BUN 12 06/25/2024   CREATININE 0.87 06/25/2024   EGFR 77 06/25/2024   CALCIUM  9.9 06/25/2024   PROT 7.2 06/25/2024    ALBUMIN 4.9 06/25/2024   LABGLOB 2.3 06/25/2024   BILITOT 0.5 06/25/2024   ALKPHOS 84 06/25/2024   AST 19 06/25/2024   ALT 16 06/25/2024   ANIONGAP 14 01/03/2024   Last lipids Lab Results  Component Value Date   CHOL 187 06/25/2024   HDL 63 06/25/2024   LDLCALC 115 (H) 06/25/2024   TRIG 45 06/25/2024   CHOLHDL 3.0 06/25/2024   Last hemoglobin A1c Lab Results  Component Value Date   HGBA1C 6.0 (H) 06/25/2024   Last thyroid functions Lab Results  Component Value Date   TSH 1.780 06/25/2024   Last vitamin D  Lab Results  Component Value Date   VD25OH 42.5 06/25/2024      The 10-year ASCVD risk score (Arnett DK, et al., 2019) is: 3.8%    Assessment & Plan:   Supraventricular tachycardia -     Metoprolol  Succinate ER; Take 1 tablet (25 mg total) by mouth daily.  Dispense: 90 tablet; Refill: 3 -     Ambulatory referral to Cardiology  Screen for colon cancer -     Ambulatory referral to Gastroenterology  Paroxysmal supraventricular tachycardia Assessment & Plan: Per chart review of outside records, patient has diagnosed history of SVT from [facility]. Previously rate controlled with Metoprolol  and following with a cardiologist in [place].  Given patient reports chest pain with palpitations occurring intermittently, will refer to cardiology for closer supervision, echo.  Restarted Metoprolol  25 mg XL for rate control. Discussed potential side effects.    Neuropathy Assessment & Plan: Continue Gabapentin  300 mg up to 3 times daily as needed for neuropathy. This works well for her and keeps her pain well controlled.    Major depressive disorder with current active episode, unspecified depression episode severity, unspecified whether recurrent Assessment & Plan: Depression and anxiety continue to improve.  - Continue current medication regimen of Buspar , hydroxyzine , and Wellbutrin .  - Encourage therapy for ongoing support and depressive symptoms.  - Now following with  [facility] for medication management.    Moderate mixed hyperlipidemia not requiring statin therapy Assessment & Plan: Continue atorvastatin  40 mg daily for now. Will recheck lipid panel before next appt in 3 months.    Generalized anxiety disorder Assessment & Plan: PHQ-9 greatly improved.  - Continue current medication regimen of Buspar , hydroxyzine , and Wellbutrin . - Coordinate with [facility] for financial assistance and medication assistance/support. - Encourage therapy for ongoing support and depressive symptom management. Have advised her to ask the psychology office to mail her the new patient forms so that she can fill them out with no required use of cell phone or reliance  on transportation to their office to fill out in person.    Healthcare maintenance Assessment & Plan: Approved for lung cancer screening and mammogram. Pap smear and colonoscopy due. Vaccines up to date. - Schedule lung cancer screening and mammogram at [facility]. - Plan Pap smear at next appointment. Patient declined doing it today.  - Plan colonoscopy screening.      Return in about 3 months (around 01/21/2025) for Pap smear, med check.    Saddie JULIANNA Sacks, PA-C

## 2024-10-23 NOTE — Patient Instructions (Signed)
 VISIT SUMMARY: During your visit, we discussed your neck pain, mood disturbances, cardiovascular symptoms, and medication management. We also reviewed your preventive care needs, including cancer screenings.  YOUR PLAN: SUPRAVENTRICULAR TACHYCARDIA: You have intermittent neck pain that might be related to your heart. You also reported recent chest pain. -You are referred to cardiology for further evaluation and potential ablation. -You are prescribed metoprolol  at the lowest dose. The prescription has been sent to [facility] on [address].  HYPERLIPIDEMIA: Your LDL cholesterol was mildly elevated in August. -Recheck your cholesterol levels before your next visit. Please fast before the test.  MAJOR DEPRESSIVE DISORDER: You are experiencing mood swings and fatigue. You are currently taking Wellbutrin , buspar  and hydroxyzine . -Continue taking Wellbutrin  and hydroxyzine . -Follow up with [facility] as scheduled  NEUROPATHY:  -Continue taking gabapentin .  GENERAL HEALTH MAINTENANCE: You are due for several preventive screenings. -Schedule your lung cancer screening and mammogram at [facility]. -Plan to have a Pap smear at your next appointment. -Plan to schedule a colonoscopy screening. [facility] [address] [place], [place] [address] Phone [phone]   If you have any problems before your next visit feel free to message me via MyChart (minor issues or questions) or call the office, otherwise you may reach out to schedule an office visit.  Thank you! Saddie Sacks, PA-C

## 2024-10-24 ENCOUNTER — Telehealth: Payer: Self-pay

## 2024-10-24 NOTE — Telephone Encounter (Signed)
 I advised pt to reach out to her medicaid worker for a form if there is one to make the change.

## 2024-10-24 NOTE — Telephone Encounter (Signed)
 Copied from CRM #[id number]. Topic: General - Other >> Oct 24, 2024  1:03 PM Avram MATSU wrote: Reason for CRM: patient is calling to get a form filled out for ta[place]ored plan and [place] med[place] [employer] [facility], please call pt once forms are completed [phone]

## 2024-11-08 ENCOUNTER — Other Ambulatory Visit: Payer: Self-pay

## 2024-11-08 ENCOUNTER — Telehealth: Payer: Self-pay

## 2024-11-08 DIAGNOSIS — Z122 Encounter for screening for malignant neoplasm of respiratory organs: Secondary | ICD-10-CM

## 2024-11-08 DIAGNOSIS — R928 Other abnormal and inconclusive findings on diagnostic imaging of breast: Secondary | ICD-10-CM

## 2024-11-08 NOTE — Telephone Encounter (Signed)
 New orders in

## 2024-11-08 NOTE — Telephone Encounter (Signed)
 Copied from CRM [id number]. Topic: Clinical - Request for Lab/Test Order >> Nov 08, 2024 10:14 AM Myrick T wrote: Reason for CRM: patient called stated the imaging practice advised that the orders for US  LIMITED ULTRASOUND INCLUDING AXILLA RIGHT BREAST, US  LIMITED ULTRASOUND INCLUDING AXILLA LEFT BREAST, MM3D DIAGNOSTIC MAMMOGRAM BILATERAL BREAST and CT CHEST LUNG CA SCREEN LOW DOSE W/O CM has all expired and they would need new orders. Patient needs the orders placed asap.

## 2024-11-13 ENCOUNTER — Other Ambulatory Visit: Payer: Self-pay

## 2024-11-13 ENCOUNTER — Telehealth: Payer: Self-pay | Admitting: *Deleted

## 2024-11-13 NOTE — Addendum Note (Signed)
 Addended by: ZIMMERMAN RUMPLE, Swara Donze D on: 11/13/2024 11:29 AM   Modules accepted: Orders

## 2024-11-13 NOTE — Addendum Note (Signed)
 Addended by: ZIMMERMAN RUMPLE, Artin Mceuen D on: 11/13/2024 12:07 PM   Modules accepted: Orders

## 2024-11-13 NOTE — Telephone Encounter (Signed)
 Copied from CRM #[id number]. Topic: Clinical - Request for Lab/Test Order >> Nov 13, 2024 10:02 AM Myrick T wrote: Reason for CRM: Consuelo from [facility] called stated they have patient scheduled Tues for a CT but the orders needs to be changed from [facility] to [facility] or [facility]. The orders need to be changed by Monday at 10am or they will need to cancel the patients appt

## 2024-11-19 ENCOUNTER — Other Ambulatory Visit

## 2024-11-27 NOTE — Telephone Encounter (Signed)
 Copied from CRM #[id number]. Topic: Referral - Question >> Nov 27, 2024 12:10 PM Emylou G wrote: Reason for CRM: Roselie w/ [facility].. called said rcvd referral.. but waiting on auth to be done.. so that she can schedule.. [phone] ( lung cancer screening )

## 2024-12-02 ENCOUNTER — Other Ambulatory Visit: Payer: Self-pay

## 2024-12-02 NOTE — Progress Notes (Signed)
 Spoke with [employer] and completed peer to peer review.   Informed radiologist that patient has smoked 1/2 ppd x 40 years and is currently still smoking, which equals a 20 pack year history. Given patient's age > 53 and currently still smoking, this qualifies her for LDCT lung cancer screening per the [place]PSTF cancer screening guidelines.   They informed me that LDCT has been approved and said a fax will be coming in shortly with the authorization.  The approval number is [id number].   I have routed this information to referral coordinator and requested that [facility] call patient to schedule the imaging.   Saddie JULIANNA Sacks, PA-C

## 2024-12-02 NOTE — Progress Notes (Signed)
 Last mammogram from [facility] from [facility] in [place], [place] :   Results - BI Mammogram Screening Tomosynthesis Bilateral W CAD Standard Protocol (02/03/2022 3:15 PM EDT) Procedure Note  Christin Ip, MD - 02/04/2022  Formatting of this note might be different from the original. HISTORY: Patient is 59 years old and is seen for screening.  FILMS COMPARED: This is a baseline study.  MAMMOGRAM FINDINGS: The following mammographic views were obtained: bilateral craniocaudal, bilateral mediolateral oblique, and bilateral tomosynthesis. Computer-aided detection was utilized by the radiologist in the interpretation of this examination.  There are scattered fibroglandular densities.  Finding 1: There is a focal asymmetry seen in the upper inner quadrant of the right breast.  Finding 2: There is an isodense, oval mass measuring 5 millimeters with circumscribed margins seen in the middle region of the left breast at 3 o'clock.  IMPRESSION: Finding 1: Focal asymmetry in the right breast requires additional evaluation. Spot compression is recommended. A possible ultrasound may be warranted following the mammographic views. Tomographic views are also recommended.  Finding 2: Mass in the left breast requires additional evaluation. An ultrasound exam is recommended.  BI-RADS Category 0: Incomplete: Additional Evaluation Needed.     Patient required follow up evaluation. [facility] is refusing to schedule the patient without records, even though records are listed clearly in the chart.  I have reached out to referral coordinator with recommendations on next steps so that patient can get the appropriate care they need.   Kristine JULIANNA Sacks, PA-C

## 2024-12-09 ENCOUNTER — Other Ambulatory Visit

## 2024-12-09 ENCOUNTER — Encounter

## 2024-12-13 ENCOUNTER — Encounter

## 2024-12-13 ENCOUNTER — Inpatient Hospital Stay: Admission: RE | Admit: 2024-12-13

## 2024-12-13 ENCOUNTER — Other Ambulatory Visit

## 2024-12-17 ENCOUNTER — Other Ambulatory Visit (HOSPITAL_COMMUNITY)

## 2024-12-27 ENCOUNTER — Other Ambulatory Visit

## 2024-12-27 ENCOUNTER — Encounter

## 2024-12-30 ENCOUNTER — Encounter

## 2024-12-30 ENCOUNTER — Other Ambulatory Visit

## 2025-01-15 ENCOUNTER — Other Ambulatory Visit

## 2025-01-22 ENCOUNTER — Ambulatory Visit
